# Patient Record
Sex: Female | Born: 1960
Health system: Southern US, Community
[De-identification: ages and names within clinical notes are randomized; demographics above are authoritative.]

## PROBLEM LIST (undated history)

## (undated) DIAGNOSIS — K219 Gastro-esophageal reflux disease without esophagitis: Secondary | ICD-10-CM

## (undated) DIAGNOSIS — N840 Polyp of corpus uteri: Secondary | ICD-10-CM

## (undated) DIAGNOSIS — I10 Essential (primary) hypertension: Secondary | ICD-10-CM

## (undated) DIAGNOSIS — Z85850 Personal history of malignant neoplasm of thyroid: Secondary | ICD-10-CM

## (undated) DIAGNOSIS — E282 Polycystic ovarian syndrome: Secondary | ICD-10-CM

## (undated) DIAGNOSIS — T7840XA Allergy, unspecified, initial encounter: Secondary | ICD-10-CM

## (undated) DIAGNOSIS — E890 Postprocedural hypothyroidism: Secondary | ICD-10-CM

## (undated) DIAGNOSIS — N939 Abnormal uterine and vaginal bleeding, unspecified: Secondary | ICD-10-CM

## (undated) DIAGNOSIS — Z9889 Other specified postprocedural states: Secondary | ICD-10-CM

## (undated) DIAGNOSIS — K8000 Calculus of gallbladder with acute cholecystitis without obstruction: Secondary | ICD-10-CM

## (undated) DIAGNOSIS — D259 Leiomyoma of uterus, unspecified: Secondary | ICD-10-CM

## (undated) DIAGNOSIS — R51 Headache: Secondary | ICD-10-CM

## (undated) DIAGNOSIS — A6000 Herpesviral infection of urogenital system, unspecified: Secondary | ICD-10-CM

## (undated) DIAGNOSIS — Z973 Presence of spectacles and contact lenses: Secondary | ICD-10-CM

## (undated) DIAGNOSIS — E079 Disorder of thyroid, unspecified: Secondary | ICD-10-CM

## (undated) DIAGNOSIS — E669 Obesity, unspecified: Secondary | ICD-10-CM

## (undated) DIAGNOSIS — R112 Nausea with vomiting, unspecified: Secondary | ICD-10-CM

## (undated) DIAGNOSIS — R519 Headache, unspecified: Secondary | ICD-10-CM

## (undated) DIAGNOSIS — E039 Hypothyroidism, unspecified: Secondary | ICD-10-CM

## (undated) DIAGNOSIS — N926 Irregular menstruation, unspecified: Secondary | ICD-10-CM

## (undated) DIAGNOSIS — Z8719 Personal history of other diseases of the digestive system: Secondary | ICD-10-CM

## (undated) DIAGNOSIS — Z9884 Bariatric surgery status: Secondary | ICD-10-CM

## (undated) HISTORY — DX: Essential (primary) hypertension: I10

## (undated) HISTORY — DX: Obesity, unspecified: E66.9

## (undated) HISTORY — DX: Herpesviral infection of urogenital system, unspecified: A60.00

## (undated) HISTORY — DX: Polycystic ovarian syndrome: E28.2

## (undated) HISTORY — DX: Disorder of thyroid, unspecified: E07.9

## (undated) HISTORY — DX: Allergy, unspecified, initial encounter: T78.40XA

## (undated) HISTORY — PX: REDUCTION MAMMAPLASTY: SUR839

---
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
## 1992-06-03 HISTORY — PX: BREAST REDUCTION SURGERY: SHX8

## 1999-08-20 ENCOUNTER — Other Ambulatory Visit: Admission: RE | Admit: 1999-08-20 | Discharge: 1999-08-20 | Payer: Self-pay | Admitting: Obstetrics and Gynecology

## 1999-10-09 ENCOUNTER — Encounter: Payer: Self-pay | Admitting: Obstetrics and Gynecology

## 1999-10-09 ENCOUNTER — Ambulatory Visit (HOSPITAL_COMMUNITY): Admission: RE | Admit: 1999-10-09 | Discharge: 1999-10-09 | Payer: Self-pay | Admitting: Interventional Radiology

## 1999-11-12 ENCOUNTER — Encounter: Payer: Self-pay | Admitting: Obstetrics and Gynecology

## 1999-11-12 ENCOUNTER — Ambulatory Visit (HOSPITAL_COMMUNITY): Admission: RE | Admit: 1999-11-12 | Discharge: 1999-11-12 | Payer: Self-pay | Admitting: Obstetrics and Gynecology

## 1999-11-27 ENCOUNTER — Inpatient Hospital Stay (HOSPITAL_COMMUNITY): Admission: RE | Admit: 1999-11-27 | Discharge: 1999-11-29 | Payer: Self-pay | Admitting: Obstetrics and Gynecology

## 2000-03-06 ENCOUNTER — Encounter: Admission: RE | Admit: 2000-03-06 | Discharge: 2000-06-04 | Payer: Self-pay | Admitting: Family Medicine

## 2000-07-15 ENCOUNTER — Encounter: Admission: RE | Admit: 2000-07-15 | Discharge: 2000-10-13 | Payer: Self-pay | Admitting: Family Medicine

## 2000-11-01 HISTORY — PX: MYOMECTOMY: SHX85

## 2001-02-26 ENCOUNTER — Encounter: Admission: RE | Admit: 2001-02-26 | Discharge: 2001-05-27 | Payer: Self-pay | Admitting: Family Medicine

## 2001-06-12 ENCOUNTER — Ambulatory Visit (HOSPITAL_COMMUNITY): Admission: RE | Admit: 2001-06-12 | Discharge: 2001-06-12 | Payer: Self-pay | Admitting: Family Medicine

## 2001-06-12 ENCOUNTER — Encounter: Payer: Self-pay | Admitting: Family Medicine

## 2001-07-23 ENCOUNTER — Other Ambulatory Visit: Admission: RE | Admit: 2001-07-23 | Discharge: 2001-07-23 | Payer: Self-pay | Admitting: Gynecology

## 2001-09-08 ENCOUNTER — Ambulatory Visit (HOSPITAL_COMMUNITY): Admission: RE | Admit: 2001-09-08 | Discharge: 2001-09-08 | Payer: Self-pay | Admitting: Obstetrics and Gynecology

## 2001-09-08 ENCOUNTER — Encounter (INDEPENDENT_AMBULATORY_CARE_PROVIDER_SITE_OTHER): Payer: Self-pay | Admitting: Specialist

## 2002-04-21 ENCOUNTER — Ambulatory Visit (HOSPITAL_COMMUNITY): Admission: RE | Admit: 2002-04-21 | Discharge: 2002-04-21 | Payer: Self-pay

## 2002-06-04 ENCOUNTER — Encounter: Payer: Self-pay | Admitting: *Deleted

## 2002-06-04 ENCOUNTER — Inpatient Hospital Stay (HOSPITAL_COMMUNITY): Admission: RE | Admit: 2002-06-04 | Discharge: 2002-06-04 | Payer: Self-pay | Admitting: *Deleted

## 2002-07-13 ENCOUNTER — Ambulatory Visit (HOSPITAL_COMMUNITY): Admission: RE | Admit: 2002-07-13 | Discharge: 2002-07-13 | Payer: Self-pay

## 2002-09-27 ENCOUNTER — Inpatient Hospital Stay (HOSPITAL_COMMUNITY): Admission: AD | Admit: 2002-09-27 | Discharge: 2002-09-27 | Payer: Self-pay

## 2002-09-28 ENCOUNTER — Inpatient Hospital Stay (HOSPITAL_COMMUNITY): Admission: AD | Admit: 2002-09-28 | Discharge: 2002-09-28 | Payer: Self-pay

## 2002-10-01 ENCOUNTER — Inpatient Hospital Stay (HOSPITAL_COMMUNITY): Admission: AD | Admit: 2002-10-01 | Discharge: 2002-10-01 | Payer: Self-pay

## 2002-10-05 ENCOUNTER — Encounter: Admission: RE | Admit: 2002-10-05 | Discharge: 2002-10-05 | Payer: Self-pay

## 2002-10-08 ENCOUNTER — Encounter: Admission: RE | Admit: 2002-10-08 | Discharge: 2002-10-08 | Payer: Self-pay

## 2002-10-12 ENCOUNTER — Encounter: Admission: RE | Admit: 2002-10-12 | Discharge: 2002-10-12 | Payer: Self-pay

## 2002-10-15 ENCOUNTER — Inpatient Hospital Stay (HOSPITAL_COMMUNITY): Admission: AD | Admit: 2002-10-15 | Discharge: 2002-10-15 | Payer: Self-pay

## 2002-10-15 ENCOUNTER — Encounter: Admission: RE | Admit: 2002-10-15 | Discharge: 2002-10-15 | Payer: Self-pay

## 2002-10-19 ENCOUNTER — Encounter: Admission: RE | Admit: 2002-10-19 | Discharge: 2002-10-19 | Payer: Self-pay

## 2002-10-20 ENCOUNTER — Inpatient Hospital Stay (HOSPITAL_COMMUNITY): Admission: AD | Admit: 2002-10-20 | Discharge: 2002-10-22 | Payer: Self-pay

## 2002-10-23 ENCOUNTER — Encounter: Admission: RE | Admit: 2002-10-23 | Discharge: 2002-11-22 | Payer: Self-pay

## 2004-03-14 ENCOUNTER — Other Ambulatory Visit: Admission: RE | Admit: 2004-03-14 | Discharge: 2004-03-14 | Payer: Self-pay | Admitting: Obstetrics and Gynecology

## 2005-04-11 ENCOUNTER — Other Ambulatory Visit: Admission: RE | Admit: 2005-04-11 | Discharge: 2005-04-11 | Payer: Self-pay | Admitting: Obstetrics and Gynecology

## 2006-05-07 ENCOUNTER — Other Ambulatory Visit: Admission: RE | Admit: 2006-05-07 | Discharge: 2006-05-07 | Payer: Self-pay | Admitting: Obstetrics and Gynecology

## 2006-05-08 ENCOUNTER — Encounter: Admission: RE | Admit: 2006-05-08 | Discharge: 2006-05-08 | Payer: Self-pay | Admitting: Obstetrics and Gynecology

## 2007-06-10 ENCOUNTER — Other Ambulatory Visit: Admission: RE | Admit: 2007-06-10 | Discharge: 2007-06-10 | Payer: Self-pay | Admitting: Obstetrics and Gynecology

## 2008-04-08 ENCOUNTER — Encounter: Admission: RE | Admit: 2008-04-08 | Discharge: 2008-04-08 | Payer: Self-pay | Admitting: Obstetrics and Gynecology

## 2008-07-15 ENCOUNTER — Other Ambulatory Visit: Admission: RE | Admit: 2008-07-15 | Discharge: 2008-07-15 | Payer: Self-pay | Admitting: Obstetrics and Gynecology

## 2009-05-29 ENCOUNTER — Encounter: Admission: RE | Admit: 2009-05-29 | Discharge: 2009-05-29 | Payer: Self-pay | Admitting: Obstetrics and Gynecology

## 2009-08-25 ENCOUNTER — Other Ambulatory Visit: Admission: RE | Admit: 2009-08-25 | Discharge: 2009-08-25 | Payer: Self-pay | Admitting: Obstetrics and Gynecology

## 2010-05-04 ENCOUNTER — Encounter: Admission: RE | Admit: 2010-05-04 | Discharge: 2010-05-04 | Payer: Self-pay | Admitting: Internal Medicine

## 2010-06-23 ENCOUNTER — Encounter: Payer: Self-pay | Admitting: Internal Medicine

## 2010-10-19 NOTE — Op Note (Signed)
[facility]  Patient:    Stephanie [name], Stephanie [name] Visit Number: [id number] MRN: [id number]          Service Type: DSU Location: [facility] Attending Physician:  Teodora Medici Cabitt Dictated by:   Leatha Gilding. Mezer, M.D. Proc. Date: 09/08/01 Admit Date:  09/08/2001 Discharge Date: 09/08/2001   CC:         Annabell Howells, M.D.; [facility] [address] Vonna [place], [place] [address]                           Operative Report  PREOPERATIVE DIAGNOSES:       Submucous leiomyoma.  POSTOPERATIVE DIAGNOSES:      Endometrial polyp.  SURGEON:                      Leatha Gilding. Mezer, M.D.  ANESTHESIA:                   General.  PREPARATION:                  Betadine.  PROCEDURE:                    With the patient in the lithotomy position she is prepped and draped in a routine fashion.  The uterus sounded to just under 8 cm and the cervix was very easily dilated.  There was tremendous difficulty getting good visualization with the hysteroscope as the balance between the fluid and suction was so exceedingly fine that multiple scopes were tried with no improvement.  Eventually, very adequate visualization of the cavity was obtained and the cavity was remarkably smooth and symmetrical.  The area that had been abnormal on the hysterosalpingogram in the left cornua turned out to have a small amount of polypoid tissue.  This area was probed with the grasping forceps and no myoma was identified.  The polypoid tissue was removed in pieces and one piece sent for pathology.  As it was exceedingly difficult to keep the field, a great attention was to removing the polyp than obtaining tissue.  No D&C was performed.  There was minimal bleeding at the end of the procedure.  Sorbitol was used as the distention medium with a 50 cc deficit. The sponge, instrument, needle counts were correct.  The estimated blood loss was less than 10 cc.  The patient tolerated procedure well.   Was taken to recovery room in satisfactory condition. Dictated by:   Leatha Gilding. Mezer, M.D. Attending Physician:  Rolinda Roan DD:  09/08/01 TD:  09/08/01 Job: [id number]

## 2010-10-19 NOTE — Op Note (Signed)
NAME:  Stephanie [name], Stephanie [name]                         ACCOUNT NO.:  [id number]   MEDICAL RECORD NO.:  [id number]                   PATIENT TYPE:  MAT   LOCATION:  [facility]                                 FACILITY:  [facility]   PHYSICIAN:  Ronda Fairly. Galen Daft, M.D.              DATE OF BIRTH:  March 24, 1961   DATE OF PROCEDURE:  10/20/2002  DATE OF DISCHARGE:  09/28/2002                                 OPERATIVE REPORT   PREOPERATIVE DIAGNOSES:  1. Thirty-seven weeks with pregnancy-induced hypertension and edema.  2. Prior abdominal myomectomy.   POSTOPERATIVE DIAGNOSES:  1. Thirty-seven weeks with pregnancy-induced hypertension and edema.  2. Prior abdominal myomectomy.  3. Uterine fibroids noted on the posterior wall of the uterus.   PROCEDURE:  Primary cesarean section.   SURGEON:  Ronda Fairly. Galen Daft, M.D.   ASSISTANT:  Georgina Peer, M.D.   ESTIMATED BLOOD LOSS:  1500 mL.   ANESTHESIA:  Spinal.   COMPLICATIONS:  None.   FINDINGS:  Female infant, Apgars 8 at one minute, 9 at five minutes.  Cord pH  7.20.  Weight 6 pounds 14 ounces.  Name:  Stephanie [name].   DESCRIPTION OF PROCEDURE:  The patient was identified positively.  She  received prophylactic antibiotics.  A Pfannenstiel incision was not  utilized.  The patient had a prior scar up and down, and this was therefore  utilized after Betadine prep, sterile technique, and Foley catheter.  The up  and down incision was excised the scar, and this was taken down to the  fascial layer.  Considerable subcutaneous edema was noted.  The fascia was  divided and reflected laterally, and the muscles were separated.  The  peritoneum was entered at a point clear of containing structures.  The  peritoneal incision was extended up to the level near the bladder, and care  was taken to avoid the bladder and adjacent organs.  The bladder blade was  inserted, the visceral peritoneum overlying the uterus was reflected  downward, and the bladder blade was  reinserted inside this.  This allowed  for a low cervical transverse uterine incision to be carried out.  The baby  was delivered in a vertex presentation.  It was a baby boy, 6 pounds 14  ounces, Apgars 8 at one minute and 9 at five minutes.  The cord pH was 7.20.  The placenta was manually extracted, and the uterus was closed in two layers  using 0 Vicryl suture running locking, followed by an imbricating layer.  There was complete hemostasis noted.  The uterus had posterior fibroids  approximately lemon-sized, two of them, one on the left and right side, and  also a small walnut-sized fibroid on the anterior surface of the uterus.  These were not bleeding and not felt to be of great concern.  There were no  significant adhesions present.  The omentum was adherent to the fibroids,  but this  was felt to be noncontributory to any disease process.  The  anterior surface of the uterus was free of adhesions.  The uterus was placed  back in the abdominal cavity and had complete hemostasis again noted on  inspection.  Tubes and ovaries were unremarkable, although the right ovary  and tube distal segment was difficult to identify separate from the fibroid  on that side.  The abdomen was irrigated and all products of conception were  removed, the amniotic fluid, etc.  The areas were completely hemostatic.  The instrument, sponge, and needle counts were correct.  The  musculoperitoneum was closed at the midline, all subfascial tissues were  hemostatic, and the fascia was closed with #1 PDS.  The #1 PDS closure was  then followed by 3-0 Vicryl suture to reapproximate the subcutaneous  tissues, and the skin was closed with 3-0 Monocryl, followed by Steri-Strips  over Benzoin.  All instrument, sponge, and needle counts were correct  throughout the case.  The patient left the operating room in stable  condition.                                               Ronda Fairly. Galen Daft, M.D.    NJT/MEDQ  D:   10/21/2002  T:  10/21/2002  Job:  [id number]

## 2010-10-19 NOTE — Discharge Summary (Signed)
NAME:  Stephanie [name], Stephanie [name]                         ACCOUNT NO.:  [id number]   MEDICAL RECORD NO.:  [id number]                   PATIENT TYPE:  INP   LOCATION:  9147                                 FACILITY:  [facility]   PHYSICIAN:  Ronda Fairly. Galen Daft, M.D.              DATE OF BIRTH:  1960/12/10   DATE OF ADMISSION:  10/20/2002  DATE OF DISCHARGE:  10/22/2002                                 DISCHARGE SUMMARY   ADMISSION DIAGNOSIS:  1. Primary cesarean section for prior uterine surgery myomectomy  2. Term pregnancy with pregnancy induced hypertension.   PRINCIPAL DIAGNOSIS:  1. Primary cesarean section for prior uterine surgery myomectomy.  2. Term pregnancy with pregnancy induced hypertension.   COMPLICATIONS OF HOSPITALIZATION:  None.   FINAL DIAGNOSES:  1. Term pregnancy delivered.  2. Pregnancy induced hypertension, resolved.   PRINCIPAL PROCEDURE:  Primary low-transverse cesarean section.   COMPLICATIONS:  None.   HOSPITAL COURSE:  Problem 1.  Patient was admitted on the 19th.  She had  elevated blood pressures and the patient had no significant proteinuria.  She was [redacted] weeks gestation.  The patient had consented to cesarean section  for delivery secondary to prior myomectomy.  The possibility of vaginal  birth was explained but felt to be not prudent because of the prior  myomectomy and the extent of which was not certain from the operative  report.   Problem 2.  After removing the scar from the prior laparotomy incision the  abdomen was entered without difficulty.  There was marked subcutaneous edema  present upon entering the abdomen.  This was significant in it's amount.  The abdomen was entered without difficulty.  The bladder blade was utilized  and a low cervical transverse uteri incision was carried out after  developing the bladder flap.  The baby was in a vertex presentation, occiput  transverse.  It was a baby boy, named Trace Edward and pediatricians were  present.   Cord pH was 7.20 and he was handed off to the staff in excellent  condition.   Problem 3.  The Mom did well after the surgery.  She had normal return of  bowel function.  Was tolerating a regular diet.  There was a little bit of  erythema around the wound on postoperative day #2.  She had received  prophylactic antibiotics in the form of Ancef.  My concern was that perhaps  this was a resistant species to cephalosporin therefore, I started her on  Bactrim double strength.  She had no fever or elevated white count.  Her  postoperative hemoglobin was 8.8 grams.    DISPOSITION:  She was discharged home with Percocet, Bactrim DS for 14 days  twice a day.  Followup in the office in a week or two and full discharge  instructions regarding activity limits, wound care, followup in the office,  medications and diet were explained.  Ronda Fairly. Galen Daft, M.D.    NJT/MEDQ  D:  10/22/2002  T:  10/23/2002  Job:  [id number]

## 2010-10-31 ENCOUNTER — Other Ambulatory Visit: Payer: Self-pay | Admitting: Obstetrics and Gynecology

## 2010-10-31 DIAGNOSIS — Z1231 Encounter for screening mammogram for malignant neoplasm of breast: Secondary | ICD-10-CM

## 2010-11-08 ENCOUNTER — Other Ambulatory Visit: Payer: Self-pay | Admitting: Obstetrics and Gynecology

## 2010-11-08 DIAGNOSIS — N6310 Unspecified lump in the right breast, unspecified quadrant: Secondary | ICD-10-CM

## 2010-11-28 ENCOUNTER — Ambulatory Visit
Admission: RE | Admit: 2010-11-28 | Discharge: 2010-11-28 | Disposition: A | Discharge status: Home / Self Care | Payer: BC Managed Care – PPO | Source: Ambulatory Visit | Attending: Obstetrics and Gynecology | Admitting: Obstetrics and Gynecology

## 2010-11-28 ENCOUNTER — Other Ambulatory Visit: Payer: Self-pay | Admitting: Obstetrics and Gynecology

## 2010-11-28 ENCOUNTER — Ambulatory Visit: Payer: Self-pay

## 2010-11-28 DIAGNOSIS — N6310 Unspecified lump in the right breast, unspecified quadrant: Secondary | ICD-10-CM

## 2011-04-18 ENCOUNTER — Ambulatory Visit: Payer: BC Managed Care – PPO | Admitting: Internal Medicine

## 2011-04-23 ENCOUNTER — Ambulatory Visit (INDEPENDENT_AMBULATORY_CARE_PROVIDER_SITE_OTHER): Payer: BC Managed Care – PPO | Admitting: Internal Medicine

## 2011-04-23 ENCOUNTER — Encounter: Payer: Self-pay | Admitting: Internal Medicine

## 2011-04-23 DIAGNOSIS — N6002 Solitary cyst of left breast: Secondary | ICD-10-CM

## 2011-04-23 DIAGNOSIS — E282 Polycystic ovarian syndrome: Secondary | ICD-10-CM | POA: Insufficient documentation

## 2011-04-23 DIAGNOSIS — E079 Disorder of thyroid, unspecified: Secondary | ICD-10-CM | POA: Insufficient documentation

## 2011-04-23 DIAGNOSIS — N6009 Solitary cyst of unspecified breast: Secondary | ICD-10-CM

## 2011-04-23 DIAGNOSIS — T7840XA Allergy, unspecified, initial encounter: Secondary | ICD-10-CM | POA: Insufficient documentation

## 2011-04-23 DIAGNOSIS — I10 Essential (primary) hypertension: Secondary | ICD-10-CM | POA: Insufficient documentation

## 2011-04-23 DIAGNOSIS — A6000 Herpesviral infection of urogenital system, unspecified: Secondary | ICD-10-CM | POA: Insufficient documentation

## 2011-04-23 DIAGNOSIS — E669 Obesity, unspecified: Secondary | ICD-10-CM | POA: Insufficient documentation

## 2011-04-23 NOTE — Patient Instructions (Signed)
Will set up appt with Dr. Kinnie Scales

## 2011-04-23 NOTE — Progress Notes (Signed)
  Subjective:    Patient ID: Stephanie [name], female    DOB: 12/20/1960, 50 y.o.   MRN: [id number]  HPI  New pt. Here for first visit.  Former pt of mine.   PMH of Hypothyroidism, Gential herpes, HTN, PCOS, allergic rhinitis and obesity.    Overall doing very well but quite frustrated with weight issues. She has tried portion control, has seen several nutritionists. and really does not want surgical intervention.  She feels she needs something structured.  Recent TSH normal  She is up to date on all health maintenance issues.  Recent L breast cyst this year  No Known Allergies Past Medical History  Diagnosis Date  . Polycystic ovarian syndrome   . Allergy   . Hypertension   . Thyroid disease   . Obesity   . Herpes genitalia    Past Surgical History  Procedure Date  . Breast reduction surgery 1994  . Myomectomy 11/2000  . Cesarean section 5/04   History   Social History  . Marital Status: Single    Spouse Name: N/A    Number of Children: N/A  . Years of Education: N/A   Occupational History  . Not on file.   Social History Main Topics  . Smoking status: Never Smoker   . Smokeless tobacco: Never Used  . Alcohol Use: No  . Drug Use: No  . Sexually Active: Yes    Birth Control/ Protection: Pill   Other Topics Concern  . Not on file   Social History Narrative  . No narrative on file   Family History  Problem Relation Age of Onset  . Cancer Mother     leukemia  . Hypertension Father   . Heart disease Maternal Grandmother   . Heart disease Maternal Grandfather   . Aneurysm Maternal Grandfather   . Aneurysm Paternal Grandmother    Patient Active Problem List  Diagnoses  . Allergy  . Polycystic ovarian syndrome  . Hypertension  . Thyroid disease  . Herpes genitalia  . Obesity  . Single cyst of left breast   No current outpatient prescriptions on file prior to visit.        Review of Systems See HPI    Objective:   Physical Exam Physical Exam    Nursing note and vitals reviewed.  Constitutional: She is oriented to person, place, and time. She appears well-developed and well-nourished.  HENT:  Head: Normocephalic and atraumatic.  Cardiovascular: Normal rate and regular rhythm. Exam reveals no gallop and no friction rub.  No murmur heard.  Pulmonary/Chest: Breath sounds normal. She has no wheezes. She has no rales.  Neurological: She is alert and oriented to person, place, and time.  Skin: Skin is warm and dry.  Psychiatric: She has a normal mood and affect. Her behavior is normal.         Assessment & Plan:  1)  Obesity will refer to Dr. Kinnie Scales  For treatment options 2)  HTN  Well controlled 3)  Hypothyroidsim 4) PCOS  Dr. Sharl Ma following 5)  Uterine polyp 6)  L breast cyst

## 2011-07-01 ENCOUNTER — Ambulatory Visit (INDEPENDENT_AMBULATORY_CARE_PROVIDER_SITE_OTHER): Payer: BC Managed Care – PPO | Admitting: Internal Medicine

## 2011-07-01 ENCOUNTER — Encounter: Payer: Self-pay | Admitting: Internal Medicine

## 2011-07-01 DIAGNOSIS — E079 Disorder of thyroid, unspecified: Secondary | ICD-10-CM

## 2011-07-01 DIAGNOSIS — E669 Obesity, unspecified: Secondary | ICD-10-CM

## 2011-07-01 DIAGNOSIS — I10 Essential (primary) hypertension: Secondary | ICD-10-CM

## 2011-07-01 DIAGNOSIS — E282 Polycystic ovarian syndrome: Secondary | ICD-10-CM

## 2011-07-01 LAB — CBC WITH DIFFERENTIAL/PLATELET
Basophils Absolute: 0 10*3/uL (ref 0.0–0.1)
Basophils Relative: 0 % (ref 0–1)
Eosinophils Absolute: 0.1 10*3/uL (ref 0.0–0.7)
Eosinophils Relative: 1 % (ref 0–5)
HCT: 42.8 % (ref 36.0–46.0)
Hemoglobin: 14.4 g/dL (ref 12.0–15.0)
Lymphocytes Relative: 33 % (ref 12–46)
Lymphs Abs: 2.9 10*3/uL (ref 0.7–4.0)
MCH: 30.5 pg (ref 26.0–34.0)
MCHC: 33.6 g/dL (ref 30.0–36.0)
MCV: 90.7 fL (ref 78.0–100.0)
Monocytes Absolute: 0.5 10*3/uL (ref 0.1–1.0)
Monocytes Relative: 6 % (ref 3–12)
Neutro Abs: 5.4 10*3/uL (ref 1.7–7.7)
Neutrophils Relative %: 60 % (ref 43–77)
Platelets: 311 10*3/uL (ref 150–400)
RBC: 4.72 MIL/uL (ref 3.87–5.11)
RDW: 13 % (ref 11.5–15.5)
WBC: 8.9 10*3/uL (ref 4.0–10.5)

## 2011-07-01 NOTE — Patient Instructions (Signed)
Schedule Complete physical with me in June  Labs will be mailed to you

## 2011-07-01 NOTE — Progress Notes (Signed)
Subjective:    Patient ID: Stephanie [name], female    DOB: August 05, 1960, 51 y.o.   MRN: [id number]  HPI Aryelle is here for follow up on her obesity.  She has seen Dr. Kinnie Scales who would like her to start meds.  She has not had any labs since August of last year.  I do not have copy of former labs.  Doing fine.  She reports she had pap smear in June with Dr. Thomasena Edis  No Known Allergies Past Medical History  Diagnosis Date  . Polycystic ovarian syndrome   . Allergy   . Hypertension   . Thyroid disease   . Obesity   . Herpes genitalia    Past Surgical History  Procedure Date  . Breast reduction surgery 1994  . Myomectomy 11/2000  . Cesarean section 5/04   History   Social History  . Marital Status: Single    Spouse Name: N/A    Number of Children: N/A  . Years of Education: N/A   Occupational History  . Not on file.   Social History Main Topics  . Smoking status: Never Smoker   . Smokeless tobacco: Never Used  . Alcohol Use: No  . Drug Use: No  . Sexually Active: Yes    Birth Control/ Protection: Pill   Other Topics Concern  . Not on file   Social History Narrative  . No narrative on file   Family History  Problem Relation Age of Onset  . Cancer Mother     leukemia  . Hypertension Father   . Heart disease Maternal Grandmother   . Heart disease Maternal Grandfather   . Aneurysm Maternal Grandfather   . Aneurysm Paternal Grandmother    Patient Active Problem List  Diagnoses  . Allergy  . Polycystic ovarian syndrome  . Hypertension  . Thyroid disease  . Herpes genitalia  . Obesity  . Single cyst of left breast   Current Outpatient Prescriptions on File Prior to Visit  Medication Sig Dispense Refill  . amLODipine (NORVASC) 10 MG tablet Take 1 tablet by mouth Daily.      . benazepril (LOTENSIN) 20 MG tablet Take 1 tablet by mouth Daily.      Marland Kitchen LOESTRIN 24 FE 1-20 MG-MCG tablet Take 1 tablet by mouth Daily.      Marland Kitchen loratadine (CLARITIN) 10 MG tablet Take  10 mg by mouth daily as needed.        Marland Kitchen SYNTHROID 50 MCG tablet Take 1 tablet by mouth Daily.           Review of Systems    no chest pain no sob no Le edema No Headache  No visual changes Objective:   Physical Exam  Physical Exam  Nursing note and vitals reviewed.  Constitutional: She is oriented to person, place, and time. She appears well-developed and well-nourished.  HENT:  Head: Normocephalic and atraumatic.  Cardiovascular: Normal rate and regular rhythm. Exam reveals no gallop and no friction rub.  No murmur heard.  Pulmonary/Chest: Breath sounds normal. She has no wheezes. She has no rales.  Neurological: She is alert and oriented to person, place, and time.  Skin: Skin is warm and dry.  Psychiatric: She has a normal mood and affect. Her behavior is normal.        Assessment & Plan:  1)  Morbid obesity  EKG  Nonspecific  TWI V1-V2  Will check labs today along with Dr. Jennye Boroughs labs 2)  HTN  Well controlled  3)  Hypothyroidism  Check TSH today

## 2011-07-02 LAB — LIPID PANEL
Cholesterol: 185 mg/dL (ref 0–200)
HDL: 46 mg/dL (ref >39)
LDL Cholesterol: 110 mg/dL — ABNORMAL HIGH (ref 0–99)
Total CHOL/HDL Ratio: 4 Ratio
Triglycerides: 143 mg/dL (ref <150)
VLDL: 29 mg/dL (ref 0–40)

## 2011-07-02 LAB — COMPREHENSIVE METABOLIC PANEL
ALT: 37 U/L — ABNORMAL HIGH (ref 0–35)
AST: 25 U/L (ref 0–37)
Albumin: 4.2 g/dL (ref 3.5–5.2)
Alkaline Phosphatase: 45 U/L (ref 39–117)
BUN: 13 mg/dL (ref 6–23)
CO2: 25 mEq/L (ref 19–32)
Calcium: 9.3 mg/dL (ref 8.4–10.5)
Chloride: 103 mEq/L (ref 96–112)
Creat: 0.58 mg/dL (ref 0.50–1.10)
Glucose, Bld: 73 mg/dL (ref 70–99)
Potassium: 3.8 mEq/L (ref 3.5–5.3)
Sodium: 139 mEq/L (ref 135–145)
Total Bilirubin: 0.3 mg/dL (ref 0.3–1.2)
Total Protein: 7 g/dL (ref 6.0–8.3)

## 2011-07-02 LAB — HEMOGLOBIN A1C
Hgb A1c MFr Bld: 5.2 % (ref <5.7)
Mean Plasma Glucose: 103 mg/dL (ref <117)

## 2011-07-02 LAB — INSULIN, RANDOM: Insulin: 14 u[IU]/mL (ref 3–28)

## 2011-07-02 LAB — TSH: TSH: 2.453 u[IU]/mL (ref 0.350–4.500)

## 2011-07-03 ENCOUNTER — Encounter: Payer: Self-pay | Admitting: Emergency Medicine

## 2011-09-09 ENCOUNTER — Telehealth: Payer: Self-pay | Admitting: Internal Medicine

## 2011-09-09 MED ORDER — BENAZEPRIL HCL 20 MG PO TABS: 20.0000 mg | ORAL_TABLET | Freq: Every day | ORAL | Status: DC

## 2011-09-09 MED ORDER — LEVOTHYROXINE SODIUM 50 MCG PO TABS: 50.0000 ug | ORAL_TABLET | Freq: Every day | ORAL | Status: DC

## 2011-09-09 MED ORDER — AMLODIPINE BESYLATE 10 MG PO TABS: 10.0000 mg | ORAL_TABLET | Freq: Every day | ORAL | Status: DC

## 2011-09-09 NOTE — Telephone Encounter (Signed)
Spoke with pt.  She is counseled to make appt  With me in the next 6 month For CPe.  She is also aware of slightly elevated tranaminase and she tells me Dr. Kinnie Scales has repeated labs

## 2011-09-09 NOTE — Telephone Encounter (Signed)
Pt states she needs all three medications: AmLODIPine Besylate (Tab) NORVASC 10 MG Take 1 tablet by mouth Daily. Benazepril HCl (Tab) LOTENSIN 20 MG Take 1 tablet by mouth Daily.  Levothyroxine Sodium (Tab) SYNTHROID 50 MCG Take 1 tablet by mouth Daily.   They need to be call into [facility] for a 90 day refill for a year. She states you all talked about this last visit. Please call her if you has any questions [phone]. Thanks

## 2011-11-01 ENCOUNTER — Other Ambulatory Visit: Payer: Self-pay | Admitting: Obstetrics and Gynecology

## 2011-11-01 DIAGNOSIS — Z1231 Encounter for screening mammogram for malignant neoplasm of breast: Secondary | ICD-10-CM

## 2011-11-27 ENCOUNTER — Telehealth: Payer: Self-pay | Admitting: *Deleted

## 2011-11-27 NOTE — Telephone Encounter (Signed)
Pt has her Gyn with Dr France Ravens office.  She said she was here in 11/12 and 2/13.  She had blood work and and EKG.  She says you told her to come back in 6 months.  She states that she needs labwork done per you and then RF on B/P meds and Thyroid meds.  What labwork does she need.

## 2011-11-29 ENCOUNTER — Ambulatory Visit
Admission: RE | Admit: 2011-11-29 | Discharge: 2011-11-29 | Disposition: A | Discharge status: Home / Self Care | Payer: 59 | Source: Ambulatory Visit | Attending: Obstetrics and Gynecology | Admitting: Obstetrics and Gynecology

## 2011-11-29 DIAGNOSIS — Z1231 Encounter for screening mammogram for malignant neoplasm of breast: Secondary | ICD-10-CM

## 2011-12-03 ENCOUNTER — Other Ambulatory Visit: Payer: Self-pay | Admitting: Nurse Practitioner

## 2011-12-03 ENCOUNTER — Other Ambulatory Visit (HOSPITAL_COMMUNITY)
Admission: RE | Admit: 2011-12-03 | Discharge: 2011-12-03 | Disposition: A | Discharge status: Home / Self Care | Payer: 59 | Source: Ambulatory Visit | Attending: Obstetrics and Gynecology | Admitting: Obstetrics and Gynecology

## 2011-12-03 DIAGNOSIS — Z01419 Encounter for gynecological examination (general) (routine) without abnormal findings: Secondary | ICD-10-CM | POA: Insufficient documentation

## 2011-12-10 ENCOUNTER — Other Ambulatory Visit (INDEPENDENT_AMBULATORY_CARE_PROVIDER_SITE_OTHER): Payer: 59

## 2011-12-10 DIAGNOSIS — Z0000 Encounter for general adult medical examination without abnormal findings: Secondary | ICD-10-CM

## 2011-12-10 LAB — CBC WITH DIFFERENTIAL/PLATELET
Basophils Absolute: 0 10*3/uL (ref 0.0–0.1)
Basophils Relative: 0 % (ref 0–1)
Eosinophils Absolute: 0.1 10*3/uL (ref 0.0–0.7)
Eosinophils Relative: 1 % (ref 0–5)
HCT: 41.3 % (ref 36.0–46.0)
Hemoglobin: 13.9 g/dL (ref 12.0–15.0)
Lymphocytes Relative: 32 % (ref 12–46)
Lymphs Abs: 2.5 10*3/uL (ref 0.7–4.0)
MCH: 30.6 pg (ref 26.0–34.0)
MCHC: 33.7 g/dL (ref 30.0–36.0)
MCV: 91 fL (ref 78.0–100.0)
Monocytes Absolute: 0.4 10*3/uL (ref 0.1–1.0)
Monocytes Relative: 6 % (ref 3–12)
Neutro Abs: 4.8 10*3/uL (ref 1.7–7.7)
Neutrophils Relative %: 61 % (ref 43–77)
Platelets: 310 10*3/uL (ref 150–400)
RBC: 4.54 MIL/uL (ref 3.87–5.11)
RDW: 13.8 % (ref 11.5–15.5)
WBC: 7.9 10*3/uL (ref 4.0–10.5)

## 2011-12-11 ENCOUNTER — Encounter: Payer: Self-pay | Admitting: *Deleted

## 2011-12-11 LAB — LIPID PANEL
Cholesterol: 183 mg/dL (ref 0–200)
HDL: 47 mg/dL (ref >39)
LDL Cholesterol: 115 mg/dL — ABNORMAL HIGH (ref 0–99)
Total CHOL/HDL Ratio: 3.9 Ratio
Triglycerides: 105 mg/dL (ref <150)
VLDL: 21 mg/dL (ref 0–40)

## 2011-12-11 LAB — COMPLETE METABOLIC PANEL WITH GFR
ALT: 24 U/L (ref 0–35)
AST: 17 U/L (ref 0–37)
Albumin: 4.1 g/dL (ref 3.5–5.2)
Alkaline Phosphatase: 44 U/L (ref 39–117)
BUN: 18 mg/dL (ref 6–23)
CO2: 26 mEq/L (ref 19–32)
Calcium: 9.1 mg/dL (ref 8.4–10.5)
Chloride: 104 mEq/L (ref 96–112)
Creat: 0.77 mg/dL (ref 0.50–1.10)
GFR, Est African American: >89 mL/min
GFR, Est Non African American: >89 mL/min
Glucose, Bld: 84 mg/dL (ref 70–99)
Potassium: 4 mEq/L (ref 3.5–5.3)
Sodium: 139 mEq/L (ref 135–145)
Total Bilirubin: 0.6 mg/dL (ref 0.3–1.2)
Total Protein: 6.4 g/dL (ref 6.0–8.3)

## 2011-12-11 LAB — TSH: TSH: 3.361 u[IU]/mL (ref 0.350–4.500)

## 2011-12-11 LAB — VITAMIN D 25 HYDROXY (VIT D DEFICIENCY, FRACTURES): Vit D, 25-Hydroxy: 41 ng/mL (ref 30–89)

## 2012-02-13 ENCOUNTER — Other Ambulatory Visit: Payer: Self-pay | Admitting: Internal Medicine

## 2012-02-25 ENCOUNTER — Telehealth: Payer: Self-pay | Admitting: *Deleted

## 2012-02-25 NOTE — Telephone Encounter (Signed)
Pt concerned about recent hormonal changes and acne appt scheduled

## 2012-03-05 ENCOUNTER — Ambulatory Visit (INDEPENDENT_AMBULATORY_CARE_PROVIDER_SITE_OTHER): Payer: 59 | Admitting: Internal Medicine

## 2012-03-05 ENCOUNTER — Encounter: Payer: Self-pay | Admitting: Internal Medicine

## 2012-03-05 VITALS — BP 124/84 | HR 93 | Temp 97.4°F | Resp 16 | Wt 230.0 lb

## 2012-03-05 DIAGNOSIS — Z23 Encounter for immunization: Secondary | ICD-10-CM

## 2012-03-05 DIAGNOSIS — L709 Acne, unspecified: Secondary | ICD-10-CM

## 2012-03-05 DIAGNOSIS — L708 Other acne: Secondary | ICD-10-CM

## 2012-03-05 MED ORDER — TRETINOIN 0.025 % EX CREA: TOPICAL_CREAM | CUTANEOUS | Status: DC

## 2012-03-05 MED ORDER — LEVONORG-ETH ESTRAD TRIPHASIC PO TABS: 1.0000 | ORAL_TABLET | Freq: Every day | ORAL | Status: DC

## 2012-03-05 NOTE — Patient Instructions (Addendum)
Will change to [place] [address]

## 2012-03-05 NOTE — Progress Notes (Signed)
Subjective:    Patient ID: Stephanie [name], female    DOB: 25-Feb-1961, 51 y.o.   MRN: [id number]  HPI Pt is here for acute visit.  She is concerned about skin changes with increasing acne and wondering if it is hormonally related.  She also has a history of PCOS.  Stephanie [name] was placed on Loestrin by her former GYN  Dr. Thomasena [name] several years ago .  She is a non-smoker..  She states her menses have been getting lighter in flow She has had acne breakouts last few months and she does admit to picking on her face.  She is very happy as she has lost about 30 lbs with weight loss program with Dr. Kinnie [name]  No Known Allergies Past Medical History  Diagnosis Date  . Polycystic ovarian syndrome   . Allergy   . Hypertension   . Thyroid disease   . Obesity   . Herpes genitalia    Past Surgical History  Procedure Date  . Breast reduction surgery 1994  . Myomectomy 11/2000  . Cesarean section 5/04   History   Social History  . Marital Status: Single    Spouse Name: N/A    Number of Children: N/A  . Years of Education: N/A   Occupational History  . Not on file.   Social History Main Topics  . Smoking status: Never Smoker   . Smokeless tobacco: Never Used  . Alcohol Use: No  . Drug Use: No  . Sexually Active: Yes    Birth Control/ Protection: Pill   Other Topics Concern  . Not on file   Social History Narrative  . No narrative on file   Family History  Problem Relation Age of Onset  . Cancer Mother     leukemia  . Hypertension Father   . Heart disease Maternal Grandmother   . Heart disease Maternal Grandfather   . Aneurysm Maternal Grandfather   . Aneurysm Paternal Grandmother    Patient Active Problem List  Diagnosis  . Allergy  . Polycystic ovarian syndrome  . Hypertension  . Thyroid disease  . Herpes genitalia  . Obesity  . Single cyst of left breast   Current Outpatient Prescriptions on File Prior to Visit  Medication Sig Dispense Refill  . amLODipine (NORVASC)  10 MG tablet Take 1 tablet (10 mg total) by mouth daily.  90 tablet  1  . benazepril (LOTENSIN) 20 MG tablet Take 1 tablet (20 mg total) by mouth daily.  90 tablet  1  . levothyroxine (SYNTHROID, LEVOTHROID) 50 MCG tablet TAKE 1 TABLET DAILY  90 tablet  3  . LOESTRIN 24 FE 1-20 MG-MCG tablet Take 1 tablet by mouth Daily.      Marland Kitchen loratadine (CLARITIN) 10 MG tablet Take 10 mg by mouth daily as needed.        . phentermine 37.5 MG capsule Take 37.5 mg by mouth every morning.      Marland Kitchen spironolactone (ALDACTONE) 50 MG tablet Take 50 mg by mouth 2 (two) times daily.           Review of Systems See HPI    Objective:   Physical Exam Physical Exam  Nursing note and vitals reviewed.  Constitutional: She is oriented to person, place, and time. She appears well-developed and well-nourished.  HENT:  Head: Normocephalic and atraumatic.  Cardiovascular: Normal rate and regular rhythm. Exam reveals no gallop and no friction rub.  No murmur heard.  Pulmonary/Chest: Breath sounds normal. She has no  wheezes. She has no rales.  Neurological: She is alert and oriented to person, place, and time.  Skin: Skin is warm and dry. Face few healing inflammatory lesions with a few blackheads Psychiatric: She has a normal mood and affect. Her behavior is normal.        Assessment & Plan:  Acne:  May be due to increasing ratio of testosterone in peri-menopausal woman  Will try a triphasic OC  Along with retin A creme.  Advise to avoid eye, nasal folds, and corners of mouth.  Start qod cream and advance to daily as skin tolerates  PCOS  We also discussed possiblity of coming off of Oc's but decision is to be made when she sees Stephanie [name] her endocrinologist

## 2012-03-06 ENCOUNTER — Encounter: Payer: Self-pay | Admitting: Internal Medicine

## 2012-03-06 DIAGNOSIS — L709 Acne, unspecified: Secondary | ICD-10-CM | POA: Insufficient documentation

## 2012-04-04 ENCOUNTER — Other Ambulatory Visit: Payer: Self-pay | Admitting: Internal Medicine

## 2012-05-04 ENCOUNTER — Ambulatory Visit: Payer: 59 | Admitting: Internal Medicine

## 2012-08-11 ENCOUNTER — Telehealth: Payer: Self-pay | Admitting: *Deleted

## 2012-08-11 NOTE — Telephone Encounter (Signed)
Pt wants a name of a good podiatrist

## 2012-08-12 NOTE — Telephone Encounter (Signed)
I like Dr. Jeni Salles (not sure if spelled correctly  On [address]  She does not need a formal referral from [place]

## 2012-08-18 NOTE — Telephone Encounter (Signed)
LVM with name of podiatrist

## 2012-10-14 ENCOUNTER — Other Ambulatory Visit: Payer: Self-pay | Admitting: Internal Medicine

## 2012-10-14 NOTE — Telephone Encounter (Signed)
Refill request

## 2012-10-20 ENCOUNTER — Ambulatory Visit (INDEPENDENT_AMBULATORY_CARE_PROVIDER_SITE_OTHER): Payer: 59 | Admitting: Internal Medicine

## 2012-10-20 ENCOUNTER — Encounter: Payer: Self-pay | Admitting: Internal Medicine

## 2012-10-20 VITALS — BP 113/76 | HR 85 | Temp 97.4°F | Resp 18 | Wt 226.0 lb

## 2012-10-20 DIAGNOSIS — J329 Chronic sinusitis, unspecified: Secondary | ICD-10-CM

## 2012-10-20 DIAGNOSIS — R05 Cough: Secondary | ICD-10-CM

## 2012-10-20 DIAGNOSIS — R059 Cough, unspecified: Secondary | ICD-10-CM

## 2012-10-20 DIAGNOSIS — T7840XD Allergy, unspecified, subsequent encounter: Secondary | ICD-10-CM

## 2012-10-20 DIAGNOSIS — Z5189 Encounter for other specified aftercare: Secondary | ICD-10-CM

## 2012-10-20 MED ORDER — METHYLPREDNISOLONE ACETATE 80 MG/ML IJ SUSP
120.0000 mg | Freq: Once | INTRAMUSCULAR | Status: AC
  Administered 2012-10-20: 120 mg via INTRAMUSCULAR

## 2012-10-20 MED ORDER — AZITHROMYCIN 250 MG PO TABS: ORAL_TABLET | ORAL | Status: DC

## 2012-10-20 NOTE — Patient Instructions (Addendum)
See me in one week

## 2012-10-20 NOTE — Progress Notes (Signed)
  Subjective:    Patient ID: Stephanie [name], female    DOB: 01-08-1961, 52 y.o.   MRN: [id number]  HPI  Lalania is here for acute visit.   She has been having a cough for two weeks nonproductive.  Lots of sinus pressure but does not have much sinus drainage.  No documented fever.  No chest pain no sob no wheezing  She has been using Mucinex D  OTC  Review of Systems    see HPI Objective:   Physical Exam Physical Exam  Peak flow 370 Constitutional: She is oriented to person, place, and time. She appears well-developed and well-nourished. She is cooperative.  HENT:  Head: Normocephalic and atraumatic.  Right Ear: A middle ear effusion is present.  Left Ear: A middle ear effusion is present.  Nose: Mucosal edema present. Right sinus exhibits maxillary sinus tenderness. Left sinus exhibits maxillary sinus tenderness.  Mouth/Throat: Posterior oropharyngeal erythema present.  Serous effusion bilaterally  Eyes: Conjunctivae and EOM are normal. Pupils are equal, round, and reactive to light.  Neck: Neck supple. Carotid bruit is not present. No mass present.  Cardiovascular: Regular rhythm, normal heart sounds, intact distal pulses and normal pulses. Exam reveals no gallop and no friction rub.  No murmur heard.  Pulmonary/Chest: Breath sounds normal. She has no wheezes. She has no rhonchi. She has no rales.  Neurological: She is alert and oriented to person, place, and time.  Skin: Skin is warm and dry. No abrasion, no bruising, no ecchymosis and no rash noted. No cyanosis. Nails show no clubbing.  Psychiatric: She has a normal mood and affect. Her speech is normal and behavior is normal.             Assessment & Plan:  Sinusitis  Will give Z-pak  Allergic rhinitis:  Will give Depo-medrol 120 mg today.  Take claritin D daily  Cough  Likely allergy related  OK for OTC delsym.  Pt counseled to see me next week.  If cough still present will get CXR  She voices understanding  See  me in one week

## 2012-10-29 ENCOUNTER — Ambulatory Visit (INDEPENDENT_AMBULATORY_CARE_PROVIDER_SITE_OTHER): Payer: 59 | Admitting: Internal Medicine

## 2012-10-29 ENCOUNTER — Encounter: Payer: Self-pay | Admitting: Internal Medicine

## 2012-10-29 ENCOUNTER — Ambulatory Visit (HOSPITAL_BASED_OUTPATIENT_CLINIC_OR_DEPARTMENT_OTHER)
Admission: RE | Admit: 2012-10-29 | Discharge: 2012-10-29 | Disposition: A | Discharge status: Home / Self Care | Payer: 59 | Source: Ambulatory Visit | Attending: Internal Medicine | Admitting: Internal Medicine

## 2012-10-29 ENCOUNTER — Ambulatory Visit: Payer: 59 | Admitting: Internal Medicine

## 2012-10-29 VITALS — BP 120/80 | HR 94 | Temp 97.6°F | Resp 18 | Wt 225.0 lb

## 2012-10-29 DIAGNOSIS — J40 Bronchitis, not specified as acute or chronic: Secondary | ICD-10-CM

## 2012-10-29 DIAGNOSIS — R059 Cough, unspecified: Secondary | ICD-10-CM | POA: Insufficient documentation

## 2012-10-29 DIAGNOSIS — R05 Cough: Secondary | ICD-10-CM

## 2012-10-29 DIAGNOSIS — J209 Acute bronchitis, unspecified: Secondary | ICD-10-CM

## 2012-10-29 DIAGNOSIS — I10 Essential (primary) hypertension: Secondary | ICD-10-CM | POA: Insufficient documentation

## 2012-10-29 MED ORDER — PREDNISONE 20 MG PO TABS: ORAL_TABLET | ORAL | Status: DC

## 2012-10-29 MED ORDER — MOMETASONE FURO-FORMOTEROL FUM 100-5 MCG/ACT IN AERO: INHALATION_SPRAY | RESPIRATORY_TRACT | Status: DC

## 2012-10-29 NOTE — Patient Instructions (Addendum)
See me in 4 weeks 

## 2012-10-29 NOTE — Progress Notes (Signed)
Subjective:    Patient ID: Stephanie [name], female    DOB: Nov 05, 1960, 52 y.o.   MRN: [id number]  HPI Torunn is here for follow up after taking Z-pak and Depomedrol  120 mg IM in office.   Pt was feeling better about 5 days after treatment.  Cough stopped for a few days but now has returned.  She coughs clear mucous at times and describes a "heaviness"   At times in her chest.  No fever or chest pain.    She does hear wheezing at times.    No Known Allergies Past Medical History  Diagnosis Date  . Polycystic ovarian syndrome   . Allergy   . Hypertension   . Thyroid disease   . Obesity   . Herpes genitalia    Past Surgical History  Procedure Laterality Date  . Breast reduction surgery  1994  . Myomectomy  11/2000  . Cesarean section  5/04   History   Social History  . Marital Status: Single    Spouse Name: N/A    Number of Children: N/A  . Years of Education: N/A   Occupational History  . Not on file.   Social History Main Topics  . Smoking status: Never Smoker   . Smokeless tobacco: Never Used  . Alcohol Use: No  . Drug Use: No  . Sexually Active: Yes    Birth Control/ Protection: Pill   Other Topics Concern  . Not on file   Social History Narrative  . No narrative on file   Family History  Problem Relation Age of Onset  . Cancer Mother     leukemia  . Hypertension Father   . Heart disease Maternal Grandmother   . Heart disease Maternal Grandfather   . Aneurysm Maternal Grandfather   . Aneurysm Paternal Grandmother    Patient Active Problem List   Diagnosis Date Noted  . Acne 03/06/2012  . Single cyst of left breast 04/23/2011  . Allergy   . Polycystic ovarian syndrome   . Hypertension   . Thyroid disease   . Herpes genitalia   . Obesity    Current Outpatient Prescriptions on File Prior to Visit  Medication Sig Dispense Refill  . amLODipine (NORVASC) 10 MG tablet TAKE 1 TABLET DAILY  90 tablet  0  . benazepril (LOTENSIN) 20 MG tablet TAKE 1  TABLET DAILY  90 tablet  0  . levonorgestrel-ethinyl estradiol (TRIVORA, 28,) tablet Take 1 tablet by mouth daily.  1 Package  11  . levothyroxine (SYNTHROID, LEVOTHROID) 50 MCG tablet TAKE 1 TABLET DAILY  90 tablet  3  . loratadine (CLARITIN) 10 MG tablet Take 10 mg by mouth daily as needed.        . phentermine 37.5 MG capsule Take 37.5 mg by mouth every morning.      Marland Kitchen spironolactone (ALDACTONE) 50 MG tablet Take 50 mg by mouth 2 (two) times daily.      Marland Kitchen topiramate (TOPAMAX) 100 MG tablet Take 100 mg by mouth daily.       No current facility-administered medications on file prior to visit.       Review of Systems See HPI    Objective:   Physical Exam Physical Exam  Nursing note and vitals reviewed.   Peak flow 360 today Constitutional: She is oriented to person, place, and time. She appears well-developed and well-nourished.  HENT:  Head: Normocephalic and atraumatic.  Cardiovascular: Normal rate and regular rhythm. Exam reveals no gallop and  no friction rub.  No murmur heard.  Pulmonary/Chest: Breath sounds normal. Few end expiratory wheezes bilaterally. She has no rales.  Neurological: She is alert and oriented to person, place, and time.  Skin: Skin is warm and dry.  Psychiatric: She has a normal mood and affect. Her behavior is normal.             Assessment & Plan:  Bronchospasm/Probable asthmatic bronchitis  Will give Prednisone 60 mg taper over 9 days.  Dulera MDI.     Prolonged cough  Will check CXR  Allergic rhinitis  Advised to take OTC Claritin D or Allegra D  See me in 4-5 weeks

## 2012-10-30 ENCOUNTER — Telehealth: Payer: Self-pay | Admitting: *Deleted

## 2012-10-30 NOTE — Telephone Encounter (Signed)
Notified pt of - chest x ray. Pt reports feeling much better after using inhaler

## 2012-10-30 NOTE — Telephone Encounter (Signed)
Message copied by Mathews Robinsons on Fri Oct 30, 2012  8:33 AM ------      Message from: Raechel Chute D      Created: Thu Oct 29, 2012  1:09 PM       Karen Kitchens            Call pt and let her know that her CXR is negative for infection ------

## 2012-11-02 ENCOUNTER — Telehealth: Payer: Self-pay | Admitting: *Deleted

## 2012-11-02 NOTE — Telephone Encounter (Signed)
Patient called Dr Constance Goltz wanted her to take Clariten D daily, until her next appointment but she went to [facility] and was unable to get it.  When they entered her DL# it said she could not buy it.  The Pharmacist recommended that she have her Dr call in a Rx for a decongestant; so she won't have this problem. [facility].

## 2012-11-04 NOTE — Telephone Encounter (Signed)
See Stephanie [name]'s note 

## 2012-12-09 ENCOUNTER — Ambulatory Visit (INDEPENDENT_AMBULATORY_CARE_PROVIDER_SITE_OTHER): Payer: 59 | Admitting: Internal Medicine

## 2012-12-09 ENCOUNTER — Encounter: Payer: Self-pay | Admitting: Internal Medicine

## 2012-12-09 VITALS — BP 121/80 | HR 92 | Temp 98.8°F | Resp 18 | Wt 231.0 lb

## 2012-12-09 DIAGNOSIS — J9801 Acute bronchospasm: Secondary | ICD-10-CM

## 2012-12-09 DIAGNOSIS — L309 Dermatitis, unspecified: Secondary | ICD-10-CM | POA: Insufficient documentation

## 2012-12-09 DIAGNOSIS — L259 Unspecified contact dermatitis, unspecified cause: Secondary | ICD-10-CM

## 2012-12-09 MED ORDER — MOMETASONE FUROATE 0.1 % EX CREA: TOPICAL_CREAM | Freq: Every day | CUTANEOUS | Status: DC

## 2012-12-09 NOTE — Patient Instructions (Addendum)
See me as needed 

## 2012-12-09 NOTE — Progress Notes (Signed)
Subjective:    Patient ID: Stephanie [name], female    DOB: 1961/04/11, 52 y.o.   MRN: [id number]  HPI  Stephanie [name] is here for follow up.  She reports she is feeling much better.  She went to see Dr. Kinnie Scales and described her symptoms.  He discussed possibility of perhaps Topamax may have been causing her symptoms.  She was on 100 mg dose. She stopped med and withing 48 hours felt much better and her breathing better.  Pt was also on prednisone and Dulera at that same time frame.  She used her [facility] for 3 weeks and then felt so much better she stopped.      Peak flow today 375  She has noted very red itchy rash back of both knees.  Not in any other location  No tick or insect recent exposure  No Known Allergies Past Medical History  Diagnosis Date  . Polycystic ovarian syndrome   . Allergy   . Hypertension   . Thyroid disease   . Obesity   . Herpes genitalia    Past Surgical History  Procedure Laterality Date  . Breast reduction surgery  1994  . Myomectomy  11/2000  . Cesarean section  5/04   History   Social History  . Marital Status: Single    Spouse Name: N/A    Number of Children: N/A  . Years of Education: N/A   Occupational History  . Not on file.   Social History Main Topics  . Smoking status: Never Smoker   . Smokeless tobacco: Never Used  . Alcohol Use: No  . Drug Use: No  . Sexually Active: Yes    Birth Control/ Protection: Pill   Other Topics Concern  . Not on file   Social History Narrative  . No narrative on file   Family History  Problem Relation Age of Onset  . Cancer Mother     leukemia  . Hypertension Father   . Heart disease Maternal Grandmother   . Heart disease Maternal Grandfather   . Aneurysm Maternal Grandfather   . Aneurysm Paternal Grandmother    Patient Active Problem List   Diagnosis Date Noted  . Acne 03/06/2012  . Single cyst of left breast 04/23/2011  . Allergy   . Polycystic ovarian syndrome   . Hypertension   .  Thyroid disease   . Herpes genitalia   . Obesity    Current Outpatient Prescriptions on File Prior to Visit  Medication Sig Dispense Refill  . amLODipine (NORVASC) 10 MG tablet TAKE 1 TABLET DAILY  90 tablet  0  . benazepril (LOTENSIN) 20 MG tablet TAKE 1 TABLET DAILY  90 tablet  0  . levonorgestrel-ethinyl estradiol (TRIVORA, 28,) tablet Take 1 tablet by mouth daily.  1 Package  11  . levothyroxine (SYNTHROID, LEVOTHROID) 50 MCG tablet TAKE 1 TABLET DAILY  90 tablet  3  . loratadine (CLARITIN) 10 MG tablet Take 10 mg by mouth daily as needed.        . phentermine 37.5 MG capsule Take 37.5 mg by mouth every morning.      Marland Kitchen spironolactone (ALDACTONE) 50 MG tablet Take 50 mg by mouth 2 (two) times daily.       No current facility-administered medications on file prior to visit.      Review of Systems See HPI    Objective:   Physical Exam  Physical Exam  Nursing note and vitals reviewed.  Constitutional: She is oriented to person,  place, and time. She appears well-developed and well-nourished.  HENT:  Head: Normocephalic and atraumatic.  Cardiovascular: Normal rate and regular rhythm. Exam reveals no gallop and no friction rub.  No murmur heard.  Pulmonary/Chest: Breath sounds normal. She has no wheezes. She has no rales.  Neurological: She is alert and oriented to person, place, and time.  Skin: Skin is warm and dry.  She has reddened maculpapular rash behind both knees.   Not present of flexroral surface of elbows or elsewhere Psychiatric: She has a normal mood and affect. Her behavior is normal.            Assessment & Plan:  Bronchospasm/?? Asthmatic bronchitis    Peak flow today 375  Improved.  Lungs clear.  NOt sure if effect of Topamax but pt off now.  Will moniter with time.  OK to hold [facility] for now  Skin rash  consistant with eczema  Okj for elocon creme once a day.  Pt counseled if rash not improving to see her dermatologist Dr. Danella Deis

## 2012-12-18 ENCOUNTER — Telehealth: Payer: Self-pay | Admitting: *Deleted

## 2012-12-18 NOTE — Telephone Encounter (Signed)
Pt called stating that rash had significantly worsened Advised pt to be seen by her Dermatologist. Sherron Monday with [facility]  at [facility] Who advised that pt needed to be seen at [facility] as there were no appt available, made and appt for pt to be seen in office on Monday.

## 2012-12-21 ENCOUNTER — Encounter: Payer: Self-pay | Admitting: Internal Medicine

## 2012-12-21 ENCOUNTER — Ambulatory Visit (INDEPENDENT_AMBULATORY_CARE_PROVIDER_SITE_OTHER): Payer: 59 | Admitting: Internal Medicine

## 2012-12-21 VITALS — BP 111/76 | HR 86 | Temp 98.1°F | Resp 18 | Wt 232.0 lb

## 2012-12-21 DIAGNOSIS — R21 Rash and other nonspecific skin eruption: Secondary | ICD-10-CM

## 2012-12-21 MED ORDER — DOXYCYCLINE HYCLATE 100 MG PO TABS: 100.0000 mg | ORAL_TABLET | Freq: Two times a day (BID) | ORAL | Status: DC

## 2012-12-21 NOTE — Progress Notes (Signed)
Subjective:    Patient ID: Stephanie [name], female    DOB: [birth year], 52 y.o.   MRN: [id number]  HPI  Stephanie [name] is here for follow up.  She reports the rash behind her knees is better but she still has red bumps along medial side of knee.    No itching she is using Elocon Bid .  She could not get appt with her dermatologist  She denies fever, headache,  Rash has not spread to any other areas.    She does spend time outside in garden  She has dogs and cats that spend time in woods  No Known Allergies Past Medical History  Diagnosis Date  . Polycystic ovarian syndrome   . Allergy   . Hypertension   . Thyroid disease   . Obesity   . Herpes genitalia    Past Surgical History  Procedure Laterality Date  . Breast reduction surgery  1994  . Myomectomy  11/2000  . Cesarean section  5/04   History   Social History  . Marital Status: Single    Spouse Name: N/A    Number of Children: N/A  . Years of Education: N/A   Occupational History  . Not on file.   Social History Main Topics  . Smoking status: Never Smoker   . Smokeless tobacco: Never Used  . Alcohol Use: No  . Drug Use: No  . Sexually Active: Yes    Birth Control/ Protection: Pill   Other Topics Concern  . Not on file   Social History Narrative  . No narrative on file   Family History  Problem Relation Age of Onset  . Cancer Mother     leukemia  . Hypertension Father   . Heart disease Maternal Grandmother   . Heart disease Maternal Grandfather   . Aneurysm Maternal Grandfather   . Aneurysm Paternal Grandmother    Patient Active Problem List   Diagnosis Date Noted  . Bronchospasm 12/09/2012  . Dermatitis 12/09/2012  . Acne 03/06/2012  . Single cyst of left breast 04/23/2011  . Allergy   . Polycystic ovarian syndrome   . Hypertension   . Thyroid disease   . Herpes genitalia   . Obesity    Current Outpatient Prescriptions on File Prior to Visit  Medication Sig Dispense Refill  . amLODipine (NORVASC)  10 MG tablet TAKE 1 TABLET DAILY  90 tablet  0  . benazepril (LOTENSIN) 20 MG tablet TAKE 1 TABLET DAILY  90 tablet  0  . levothyroxine (SYNTHROID, LEVOTHROID) 50 MCG tablet TAKE 1 TABLET DAILY  90 tablet  3  . loratadine (CLARITIN) 10 MG tablet Take 10 mg by mouth daily as needed.        . mometasone (ELOCON) 0.1 % cream Apply topically daily.  45 g  0  . phentermine 37.5 MG capsule Take 37.5 mg by mouth every morning.      Marland Kitchen spironolactone (ALDACTONE) 50 MG tablet Take 50 mg by mouth 2 (two) times daily.      Marland Kitchen levonorgestrel-ethinyl estradiol (TRIVORA, 28,) tablet Take 1 tablet by mouth daily.  1 Package  11   No current facility-administered medications on file prior to visit.       Review of Systems See HPI      Objective:   Physical Exam Physical Exam  Nursing note and vitals reviewed.  Constitutional: She is oriented to person, place, and time. She appears well-developed and well-nourished.  HENT:  Head: Normocephalic and atraumatic.  Cardiovascular: Normal rate and regular rhythm. Exam reveals no gallop and no friction rub.  No murmur heard.  Pulmonary/Chest: Breath sounds normal. She has no wheezes. She has no rales.  Neurological: She is alert and oriented to person, place, and time.  Skin: Skin is warm and dry.  Rash behind L knee is almost completely resolved.  She has small maculopapular lesions medial side of knee  Bruise on L knee after hitting knee Psychiatric: She has a normal mood and affect. Her behavior is normal.             Assessment & Plan:  Dermatitis rash.  She is not sure about tick or insect exposure.  Will check RMSF titers and since she is going out of town  Will give Doxycycline Rx 100 mg bid for 10 days.   She is to take antibiotic if she developes fever or headache.  Pt voices understanding .  Continue Elocon creme  If not resolved,  She will need skin biopsy

## 2012-12-21 NOTE — Patient Instructions (Addendum)
Take medicine if headache or fever developes  Make appt with dermatologist

## 2012-12-22 ENCOUNTER — Telehealth: Payer: Self-pay | Admitting: *Deleted

## 2012-12-22 LAB — ROCKY MTN SPOTTED FVR ABS PNL(IGG+IGM)
RMSF IgG: 0.17 IV
RMSF IgM: 0.06 IV

## 2012-12-22 NOTE — Telephone Encounter (Signed)
Message copied by Mathews Robinsons on Tue Dec 22, 2012  4:00 PM ------      Message from: Stephanie [name]      Created: Tue Dec 22, 2012  2:25 PM       Call pt and let her know that her RMSF titers are negative. ------

## 2012-12-22 NOTE — Telephone Encounter (Signed)
Notified pt of negative  RMSF

## 2013-02-08 ENCOUNTER — Encounter: Payer: Self-pay | Admitting: *Deleted

## 2013-03-04 ENCOUNTER — Other Ambulatory Visit: Payer: Self-pay | Admitting: *Deleted

## 2013-03-04 ENCOUNTER — Telehealth: Payer: Self-pay | Admitting: *Deleted

## 2013-03-04 DIAGNOSIS — E079 Disorder of thyroid, unspecified: Secondary | ICD-10-CM

## 2013-03-04 DIAGNOSIS — Z1321 Encounter for screening for nutritional disorder: Secondary | ICD-10-CM

## 2013-03-04 DIAGNOSIS — I10 Essential (primary) hypertension: Secondary | ICD-10-CM

## 2013-03-04 DIAGNOSIS — E669 Obesity, unspecified: Secondary | ICD-10-CM

## 2013-03-04 NOTE — Telephone Encounter (Signed)
Stephanie [name] is needing refill on her synthroid.  It has been since July 2013 since she had any labwork.  Mindy wants to know if she needs to see dr or if she can just come in for labs.  Please call her back and let her know.

## 2013-03-05 ENCOUNTER — Ambulatory Visit (INDEPENDENT_AMBULATORY_CARE_PROVIDER_SITE_OTHER): Payer: 59 | Admitting: *Deleted

## 2013-03-05 DIAGNOSIS — Z23 Encounter for immunization: Secondary | ICD-10-CM

## 2013-03-05 LAB — COMPREHENSIVE METABOLIC PANEL
ALT: 25 U/L (ref 0–35)
AST: 15 U/L (ref 0–37)
Albumin: 3.8 g/dL (ref 3.5–5.2)
Alkaline Phosphatase: 39 U/L (ref 39–117)
BUN: 15 mg/dL (ref 6–23)
CO2: 28 mEq/L (ref 19–32)
Calcium: 9.2 mg/dL (ref 8.4–10.5)
Chloride: 105 mEq/L (ref 96–112)
Creat: 0.66 mg/dL (ref 0.50–1.10)
Glucose, Bld: 79 mg/dL (ref 70–99)
Potassium: 4.4 mEq/L (ref 3.5–5.3)
Sodium: 140 mEq/L (ref 135–145)
Total Bilirubin: 0.3 mg/dL (ref 0.3–1.2)
Total Protein: 6.3 g/dL (ref 6.0–8.3)

## 2013-03-05 LAB — LIPID PANEL
Cholesterol: 174 mg/dL (ref 0–200)
HDL: 57 mg/dL (ref >39)
LDL Cholesterol: 99 mg/dL (ref 0–99)
Total CHOL/HDL Ratio: 3.1 Ratio
Triglycerides: 88 mg/dL (ref <150)
VLDL: 18 mg/dL (ref 0–40)

## 2013-03-05 LAB — CBC WITH DIFFERENTIAL/PLATELET
Basophils Absolute: 0 10*3/uL (ref 0.0–0.1)
Basophils Relative: 0 % (ref 0–1)
Eosinophils Absolute: 0.1 10*3/uL (ref 0.0–0.7)
Eosinophils Relative: 1 % (ref 0–5)
HCT: 38.3 % (ref 36.0–46.0)
Hemoglobin: 13.4 g/dL (ref 12.0–15.0)
Lymphocytes Relative: 34 % (ref 12–46)
Lymphs Abs: 2.9 10*3/uL (ref 0.7–4.0)
MCH: 30.8 pg (ref 26.0–34.0)
MCHC: 35 g/dL (ref 30.0–36.0)
MCV: 88 fL (ref 78.0–100.0)
Monocytes Absolute: 0.5 10*3/uL (ref 0.1–1.0)
Monocytes Relative: 6 % (ref 3–12)
Neutro Abs: 5 10*3/uL (ref 1.7–7.7)
Neutrophils Relative %: 59 % (ref 43–77)
Platelets: 358 10*3/uL (ref 150–400)
RBC: 4.35 MIL/uL (ref 3.87–5.11)
RDW: 12.6 % (ref 11.5–15.5)
WBC: 8.5 10*3/uL (ref 4.0–10.5)

## 2013-03-05 LAB — TSH: TSH: 2.131 u[IU]/mL (ref 0.350–4.500)

## 2013-03-05 NOTE — Addendum Note (Signed)
Addended by: Mathews Robinsons on: 03/05/2013 09:56 AM   Modules accepted: Level of Service

## 2013-03-05 NOTE — Progress Notes (Signed)
Patient ID: Stephanie [name], female   DOB: [birth year], 52 y.o.   MRN: [id number] Stephanie [name] is here today for her annual flu vaccination. VIS given to her. She is feeling well today with no fevers.

## 2013-03-06 LAB — VITAMIN D 25 HYDROXY (VIT D DEFICIENCY, FRACTURES): Vit D, 25-Hydroxy: 43 ng/mL (ref 30–89)

## 2013-03-08 ENCOUNTER — Encounter: Payer: Self-pay | Admitting: *Deleted

## 2013-03-24 ENCOUNTER — Other Ambulatory Visit: Payer: Self-pay | Admitting: Internal Medicine

## 2013-03-25 NOTE — Telephone Encounter (Signed)
Refill request

## 2013-04-07 ENCOUNTER — Inpatient Hospital Stay (HOSPITAL_COMMUNITY): Payer: 59 | Admitting: Anesthesiology

## 2013-04-07 ENCOUNTER — Observation Stay (HOSPITAL_BASED_OUTPATIENT_CLINIC_OR_DEPARTMENT_OTHER)
Admission: EM | Admit: 2013-04-07 | Discharge: 2013-04-08 | Disposition: A | Discharge status: Home / Self Care | Payer: 59 | Attending: Surgery | Admitting: Surgery

## 2013-04-07 ENCOUNTER — Emergency Department (HOSPITAL_BASED_OUTPATIENT_CLINIC_OR_DEPARTMENT_OTHER): Payer: 59

## 2013-04-07 ENCOUNTER — Encounter (HOSPITAL_COMMUNITY): Payer: 59 | Admitting: Anesthesiology

## 2013-04-07 ENCOUNTER — Encounter (HOSPITAL_COMMUNITY): Admission: EM | Disposition: A | Discharge status: Home / Self Care | Payer: Self-pay | Source: Home / Self Care

## 2013-04-07 ENCOUNTER — Encounter (HOSPITAL_BASED_OUTPATIENT_CLINIC_OR_DEPARTMENT_OTHER): Payer: Self-pay | Admitting: Emergency Medicine

## 2013-04-07 DIAGNOSIS — Z79899 Other long term (current) drug therapy: Secondary | ICD-10-CM | POA: Insufficient documentation

## 2013-04-07 DIAGNOSIS — K8010 Calculus of gallbladder with chronic cholecystitis without obstruction: Principal | ICD-10-CM | POA: Insufficient documentation

## 2013-04-07 DIAGNOSIS — E282 Polycystic ovarian syndrome: Secondary | ICD-10-CM | POA: Insufficient documentation

## 2013-04-07 DIAGNOSIS — K8000 Calculus of gallbladder with acute cholecystitis without obstruction: Secondary | ICD-10-CM | POA: Diagnosis present

## 2013-04-07 DIAGNOSIS — K810 Acute cholecystitis: Secondary | ICD-10-CM

## 2013-04-07 DIAGNOSIS — E039 Hypothyroidism, unspecified: Secondary | ICD-10-CM | POA: Insufficient documentation

## 2013-04-07 DIAGNOSIS — A6000 Herpesviral infection of urogenital system, unspecified: Secondary | ICD-10-CM | POA: Insufficient documentation

## 2013-04-07 DIAGNOSIS — K7689 Other specified diseases of liver: Secondary | ICD-10-CM | POA: Insufficient documentation

## 2013-04-07 DIAGNOSIS — R1011 Right upper quadrant pain: Secondary | ICD-10-CM

## 2013-04-07 DIAGNOSIS — I10 Essential (primary) hypertension: Secondary | ICD-10-CM | POA: Insufficient documentation

## 2013-04-07 HISTORY — PX: CHOLECYSTECTOMY: SHX55

## 2013-04-07 HISTORY — DX: Calculus of gallbladder with acute cholecystitis without obstruction: K80.00

## 2013-04-07 HISTORY — PX: LIVER BIOPSY: SHX301

## 2013-04-07 LAB — BASIC METABOLIC PANEL
BUN: 20 mg/dL (ref 6–23)
CO2: 25 mEq/L (ref 19–32)
Calcium: 9.8 mg/dL (ref 8.4–10.5)
Chloride: 98 mEq/L (ref 96–112)
Creatinine, Ser: 0.7 mg/dL (ref 0.50–1.10)
GFR calc Af Amer: >90 mL/min (ref >90)
GFR calc non Af Amer: >90 mL/min (ref >90)
Glucose, Bld: 97 mg/dL (ref 70–99)
Potassium: 3.9 mEq/L (ref 3.5–5.1)
Sodium: 135 mEq/L (ref 135–145)

## 2013-04-07 LAB — CBC
HCT: 42.8 % (ref 36.0–46.0)
Hemoglobin: 14.6 g/dL (ref 12.0–15.0)
MCH: 31.1 pg (ref 26.0–34.0)
MCHC: 34.1 g/dL (ref 30.0–36.0)
MCV: 91.1 fL (ref 78.0–100.0)
Platelets: 327 10*3/uL (ref 150–400)
RBC: 4.7 MIL/uL (ref 3.87–5.11)
RDW: 12.5 % (ref 11.5–15.5)
WBC: 12.3 10*3/uL — ABNORMAL HIGH (ref 4.0–10.5)

## 2013-04-07 LAB — URINALYSIS, ROUTINE W REFLEX MICROSCOPIC
Bilirubin Urine: NEGATIVE
Glucose, UA: NEGATIVE mg/dL
Hgb urine dipstick: NEGATIVE
Ketones, ur: NEGATIVE mg/dL
Leukocytes, UA: NEGATIVE
Nitrite: NEGATIVE
Protein, ur: NEGATIVE mg/dL
Specific Gravity, Urine: 1.021 (ref 1.005–1.030)
Urobilinogen, UA: 0.2 mg/dL (ref 0.0–1.0)
pH: 6 (ref 5.0–8.0)

## 2013-04-07 LAB — HEPATIC FUNCTION PANEL
ALT: 18 U/L (ref 0–35)
AST: 16 U/L (ref 0–37)
Albumin: 4 g/dL (ref 3.5–5.2)
Alkaline Phosphatase: 50 U/L (ref 39–117)
Bilirubin, Direct: <0.1 mg/dL (ref 0.0–0.3)
Total Bilirubin: 0.3 mg/dL (ref 0.3–1.2)
Total Protein: 7.8 g/dL (ref 6.0–8.3)

## 2013-04-07 LAB — TROPONIN I: Troponin I: <0.3 ng/mL (ref <0.30)

## 2013-04-07 LAB — LIPASE, BLOOD: Lipase: 22 U/L (ref 11–59)

## 2013-04-07 LAB — PREGNANCY, URINE: Preg Test, Ur: NEGATIVE

## 2013-04-07 SURGERY — LAPAROSCOPIC CHOLECYSTECTOMY WITH INTRAOPERATIVE CHOLANGIOGRAM
Anesthesia: General | Site: Abdomen | Wound class: Clean Contaminated

## 2013-04-07 MED ORDER — GLYCOPYRROLATE 0.2 MG/ML IJ SOLN
INTRAMUSCULAR | Status: DC | PRN
  Administered 2013-04-07: .6 mg via INTRAVENOUS

## 2013-04-07 MED ORDER — FENTANYL CITRATE 0.05 MG/ML IJ SOLN
25.0000 ug | INTRAMUSCULAR | Status: DC | PRN
  Administered 2013-04-07 (×2): 50 ug via INTRAVENOUS

## 2013-04-07 MED ORDER — METOPROLOL TARTRATE 1 MG/ML IV SOLN
5.0000 mg | Freq: Four times a day (QID) | INTRAVENOUS | Status: DC | PRN
  Filled 2013-04-07: qty 5

## 2013-04-07 MED ORDER — DIETHYLPROPION HCL 25 MG PO TABS: 25.0000 mg | ORAL_TABLET | Freq: Every day | ORAL | Status: DC

## 2013-04-07 MED ORDER — LACTATED RINGERS IV BOLUS (SEPSIS): 1000.0000 mL | Freq: Three times a day (TID) | INTRAVENOUS | Status: DC | PRN

## 2013-04-07 MED ORDER — PROMETHAZINE HCL 25 MG/ML IJ SOLN
12.5000 mg | Freq: Four times a day (QID) | INTRAMUSCULAR | Status: DC | PRN
  Filled 2013-04-07: qty 1

## 2013-04-07 MED ORDER — FENTANYL CITRATE 0.05 MG/ML IJ SOLN
INTRAMUSCULAR | Status: AC
  Filled 2013-04-07: qty 2

## 2013-04-07 MED ORDER — DIPHENHYDRAMINE HCL 12.5 MG/5ML PO ELIX: 12.5000 mg | ORAL_SOLUTION | Freq: Four times a day (QID) | ORAL | Status: DC | PRN

## 2013-04-07 MED ORDER — LACTATED RINGERS IV SOLN
INTRAVENOUS | Status: DC | PRN
  Administered 2013-04-07 (×2): via INTRAVENOUS

## 2013-04-07 MED ORDER — ONDANSETRON HCL 4 MG/2ML IJ SOLN: 4.0000 mg | Freq: Four times a day (QID) | INTRAMUSCULAR | Status: DC | PRN

## 2013-04-07 MED ORDER — SODIUM CHLORIDE 0.9 % IV SOLN
3.0000 g | Freq: Four times a day (QID) | INTRAVENOUS | Status: DC
  Administered 2013-04-07 – 2013-04-08 (×4): 3 g via INTRAVENOUS
  Filled 2013-04-07 (×6): qty 3

## 2013-04-07 MED ORDER — CISATRACURIUM BESYLATE (PF) 10 MG/5ML IV SOLN
INTRAVENOUS | Status: DC | PRN
  Administered 2013-04-07: 6 mg via INTRAVENOUS
  Administered 2013-04-07: 1 mg via INTRAVENOUS

## 2013-04-07 MED ORDER — LIDOCAINE HCL (CARDIAC) 20 MG/ML IV SOLN
INTRAVENOUS | Status: DC | PRN
  Administered 2013-04-07: 100 mg via INTRAVENOUS

## 2013-04-07 MED ORDER — MAGNESIUM HYDROXIDE 400 MG/5ML PO SUSP: 30.0000 mL | Freq: Two times a day (BID) | ORAL | Status: DC | PRN

## 2013-04-07 MED ORDER — LEVONORG-ETH ESTRAD TRIPHASIC PO TABS: 1.0000 | ORAL_TABLET | Freq: Every day | ORAL | Status: DC

## 2013-04-07 MED ORDER — DIPHENHYDRAMINE HCL 50 MG/ML IJ SOLN
12.5000 mg | Freq: Four times a day (QID) | INTRAMUSCULAR | Status: DC | PRN
  Administered 2013-04-07: 12.5 mg via INTRAVENOUS

## 2013-04-07 MED ORDER — LIP MEDEX EX OINT
1.0000 "application " | TOPICAL_OINTMENT | Freq: Two times a day (BID) | CUTANEOUS | Status: DC
  Administered 2013-04-07: 1 via TOPICAL
  Filled 2013-04-07: qty 7

## 2013-04-07 MED ORDER — SODIUM CHLORIDE 0.9 % IV SOLN
INTRAVENOUS | Status: AC
  Filled 2013-04-07: qty 3

## 2013-04-07 MED ORDER — DIPHENHYDRAMINE HCL 50 MG/ML IJ SOLN
INTRAMUSCULAR | Status: AC
  Filled 2013-04-07: qty 1

## 2013-04-07 MED ORDER — PROPOFOL 10 MG/ML IV BOLUS
INTRAVENOUS | Status: DC | PRN
  Administered 2013-04-07: 200 mg via INTRAVENOUS

## 2013-04-07 MED ORDER — SPIRONOLACTONE 50 MG PO TABS
50.0000 mg | ORAL_TABLET | Freq: Two times a day (BID) | ORAL | Status: DC
  Administered 2013-04-07 – 2013-04-08 (×2): 50 mg via ORAL
  Filled 2013-04-07 (×4): qty 1

## 2013-04-07 MED ORDER — BISACODYL 10 MG RE SUPP: 10.0000 mg | Freq: Two times a day (BID) | RECTAL | Status: DC | PRN

## 2013-04-07 MED ORDER — LACTATED RINGERS IV SOLN
INTRAVENOUS | Status: DC
  Administered 2013-04-07: 18:00:00 via INTRAVENOUS

## 2013-04-07 MED ORDER — PHENTERMINE HCL 37.5 MG PO CAPS: 37.5000 mg | ORAL_CAPSULE | Freq: Every day | ORAL | Status: DC

## 2013-04-07 MED ORDER — FENTANYL CITRATE 0.05 MG/ML IJ SOLN
INTRAMUSCULAR | Status: DC | PRN
  Administered 2013-04-07 (×2): 50 ug via INTRAVENOUS
  Administered 2013-04-07: 150 ug via INTRAVENOUS

## 2013-04-07 MED ORDER — GI COCKTAIL ~~LOC~~
30.0000 mL | Freq: Once | ORAL | Status: AC
  Administered 2013-04-07: 30 mL via ORAL
  Filled 2013-04-07: qty 30

## 2013-04-07 MED ORDER — NEOSTIGMINE METHYLSULFATE 1 MG/ML IJ SOLN
INTRAMUSCULAR | Status: DC | PRN
  Administered 2013-04-07: 4 mg via INTRAVENOUS

## 2013-04-07 MED ORDER — PROMETHAZINE HCL 25 MG/ML IJ SOLN
INTRAMUSCULAR | Status: AC
  Filled 2013-04-07: qty 1

## 2013-04-07 MED ORDER — ACETAMINOPHEN 650 MG RE SUPP: 650.0000 mg | Freq: Four times a day (QID) | RECTAL | Status: DC | PRN

## 2013-04-07 MED ORDER — 0.9 % SODIUM CHLORIDE (POUR BTL) OPTIME
TOPICAL | Status: DC | PRN
  Administered 2013-04-07: 1000 mL

## 2013-04-07 MED ORDER — MIDAZOLAM HCL 5 MG/5ML IJ SOLN
INTRAMUSCULAR | Status: DC | PRN
  Administered 2013-04-07: 2 mg via INTRAVENOUS

## 2013-04-07 MED ORDER — ACETAMINOPHEN 325 MG PO TABS: 650.0000 mg | ORAL_TABLET | Freq: Four times a day (QID) | ORAL | Status: DC | PRN

## 2013-04-07 MED ORDER — LEVOTHYROXINE SODIUM 50 MCG PO TABS
50.0000 ug | ORAL_TABLET | Freq: Every day | ORAL | Status: DC
  Administered 2013-04-08: 50 ug via ORAL
  Filled 2013-04-07 (×2): qty 1

## 2013-04-07 MED ORDER — DEXAMETHASONE SODIUM PHOSPHATE 10 MG/ML IJ SOLN
INTRAMUSCULAR | Status: DC | PRN
  Administered 2013-04-07: 10 mg via INTRAVENOUS

## 2013-04-07 MED ORDER — OXYCODONE HCL 5 MG PO TABS
5.0000 mg | ORAL_TABLET | ORAL | Status: DC | PRN
  Administered 2013-04-08: 10 mg via ORAL
  Administered 2013-04-08 (×2): 5 mg via ORAL
  Filled 2013-04-07: qty 1
  Filled 2013-04-07: qty 2
  Filled 2013-04-07: qty 1

## 2013-04-07 MED ORDER — MAGIC MOUTHWASH
15.0000 mL | Freq: Four times a day (QID) | ORAL | Status: DC | PRN
  Filled 2013-04-07: qty 15

## 2013-04-07 MED ORDER — SACCHAROMYCES BOULARDII 250 MG PO CAPS
250.0000 mg | ORAL_CAPSULE | Freq: Two times a day (BID) | ORAL | Status: DC
  Administered 2013-04-07 – 2013-04-08 (×2): 250 mg via ORAL
  Filled 2013-04-07 (×3): qty 1

## 2013-04-07 MED ORDER — PSYLLIUM 95 % PO PACK
1.0000 | PACK | Freq: Two times a day (BID) | ORAL | Status: DC
  Administered 2013-04-07 – 2013-04-08 (×2): 1 via ORAL
  Filled 2013-04-07 (×3): qty 1

## 2013-04-07 MED ORDER — ZOLPIDEM TARTRATE 5 MG PO TABS: 5.0000 mg | ORAL_TABLET | Freq: Every evening | ORAL | Status: DC | PRN

## 2013-04-07 MED ORDER — ALUM & MAG HYDROXIDE-SIMETH 200-200-20 MG/5ML PO SUSP: 30.0000 mL | Freq: Four times a day (QID) | ORAL | Status: DC | PRN

## 2013-04-07 MED ORDER — OXYCODONE HCL 5 MG PO TABS: 5.0000 mg | ORAL_TABLET | ORAL | Status: DC | PRN

## 2013-04-07 MED ORDER — HYDROMORPHONE HCL PF 1 MG/ML IJ SOLN: 0.5000 mg | INTRAMUSCULAR | Status: DC | PRN

## 2013-04-07 MED ORDER — ACETAMINOPHEN 10 MG/ML IV SOLN
1000.0000 mg | Freq: Once | INTRAVENOUS | Status: AC
  Administered 2013-04-07: 1000 mg via INTRAVENOUS
  Filled 2013-04-07: qty 100

## 2013-04-07 MED ORDER — AMLODIPINE BESYLATE 10 MG PO TABS
10.0000 mg | ORAL_TABLET | Freq: Every day | ORAL | Status: DC
  Administered 2013-04-08: 10 mg via ORAL
  Filled 2013-04-07 (×2): qty 1

## 2013-04-07 MED ORDER — BUPIVACAINE-EPINEPHRINE 0.25% -1:200000 IJ SOLN
INTRAMUSCULAR | Status: DC | PRN
  Administered 2013-04-07: 50 mL

## 2013-04-07 MED ORDER — PANTOPRAZOLE SODIUM 40 MG PO TBEC
40.0000 mg | DELAYED_RELEASE_TABLET | Freq: Once | ORAL | Status: AC
  Administered 2013-04-07: 40 mg via ORAL
  Filled 2013-04-07: qty 1

## 2013-04-07 MED ORDER — ACETAMINOPHEN 500 MG PO TABS
1000.0000 mg | ORAL_TABLET | Freq: Three times a day (TID) | ORAL | Status: DC
  Administered 2013-04-07 – 2013-04-08 (×2): 1000 mg via ORAL
  Filled 2013-04-07 (×4): qty 2

## 2013-04-07 MED ORDER — PROMETHAZINE HCL 25 MG/ML IJ SOLN
6.2500 mg | INTRAMUSCULAR | Status: AC | PRN
  Administered 2013-04-07 (×2): 12.5 mg via INTRAVENOUS

## 2013-04-07 MED ORDER — KCL IN DEXTROSE-NACL 40-5-0.45 MEQ/L-%-% IV SOLN: INTRAVENOUS | Status: DC

## 2013-04-07 MED ORDER — BENAZEPRIL HCL 20 MG PO TABS
20.0000 mg | ORAL_TABLET | Freq: Every day | ORAL | Status: DC
  Administered 2013-04-08: 20 mg via ORAL
  Filled 2013-04-07: qty 1

## 2013-04-07 MED ORDER — ONDANSETRON HCL 4 MG/2ML IJ SOLN
INTRAMUSCULAR | Status: DC | PRN
  Administered 2013-04-07: 4 mg via INTRAVENOUS

## 2013-04-07 MED ORDER — HYDROMORPHONE HCL PF 1 MG/ML IJ SOLN
1.0000 mg | Freq: Once | INTRAMUSCULAR | Status: AC
  Administered 2013-04-07: 1 mg via INTRAVENOUS
  Filled 2013-04-07: qty 1

## 2013-04-07 MED ORDER — LORATADINE 10 MG PO TABS
10.0000 mg | ORAL_TABLET | Freq: Every day | ORAL | Status: DC | PRN
  Filled 2013-04-07: qty 1

## 2013-04-07 MED ORDER — KCL IN DEXTROSE-NACL 40-5-0.45 MEQ/L-%-% IV SOLN
INTRAVENOUS | Status: DC
  Administered 2013-04-07 – 2013-04-08 (×2): 75 mL/h via INTRAVENOUS
  Filled 2013-04-07 (×2): qty 1000

## 2013-04-07 MED ORDER — FENTANYL CITRATE 0.05 MG/ML IJ SOLN
25.0000 ug | INTRAMUSCULAR | Status: DC | PRN
  Administered 2013-04-07: 25 ug via INTRAVENOUS
  Filled 2013-04-07: qty 2

## 2013-04-07 MED ORDER — HEPARIN SODIUM (PORCINE) 5000 UNIT/ML IJ SOLN
5000.0000 [IU] | Freq: Three times a day (TID) | INTRAMUSCULAR | Status: DC
  Administered 2013-04-08: 5000 [IU] via SUBCUTANEOUS
  Filled 2013-04-07 (×4): qty 1

## 2013-04-07 MED ORDER — SUCCINYLCHOLINE CHLORIDE 20 MG/ML IJ SOLN
INTRAMUSCULAR | Status: DC | PRN
  Administered 2013-04-07: 80 mg via INTRAVENOUS

## 2013-04-07 MED ORDER — LACTATED RINGERS IR SOLN
Status: DC | PRN
  Administered 2013-04-07: 1500 mL

## 2013-04-07 SURGICAL SUPPLY — 41 items
APPLIER CLIP 5 13 M/L LIGAMAX5 (MISCELLANEOUS)
APPLIER CLIP ROT 10 11.4 M/L (STAPLE) ×2
APR CLP MED LRG 11.4X10 (STAPLE) ×1
APR CLP MED LRG 5 ANG JAW (MISCELLANEOUS)
BAG SPEC RTRVL LRG 6X4 10 (ENDOMECHANICALS) ×1
CABLE HIGH FREQUENCY MONO STRZ (ELECTRODE) IMPLANT
CANISTER SUCTION 2500CC (MISCELLANEOUS) ×2 IMPLANT
CLIP APPLIE 5 13 M/L LIGAMAX5 (MISCELLANEOUS) IMPLANT
CLIP APPLIE ROT 10 11.4 M/L (STAPLE) IMPLANT
COVER MAYO STAND STRL (DRAPES) ×2 IMPLANT
DECANTER SPIKE VIAL GLASS SM (MISCELLANEOUS) ×2 IMPLANT
DRAPE C-ARM 42X120 X-RAY (DRAPES) ×2 IMPLANT
DRAPE LAPAROSCOPIC ABDOMINAL (DRAPES) ×2 IMPLANT
DRAPE UTILITY XL STRL (DRAPES) ×2 IMPLANT
DRAPE WARM FLUID 44X44 (DRAPE) ×2 IMPLANT
DRESSING TELFA 8X3 (GAUZE/BANDAGES/DRESSINGS) ×1 IMPLANT
DRSG TEGADERM 2-3/8X2-3/4 SM (GAUZE/BANDAGES/DRESSINGS) ×3 IMPLANT
DRSG TEGADERM 4X4.75 (GAUZE/BANDAGES/DRESSINGS) IMPLANT
ELECT REM PT RETURN 9FT ADLT (ELECTROSURGICAL) ×2
ELECTRODE REM PT RTRN 9FT ADLT (ELECTROSURGICAL) ×1 IMPLANT
ENDOLOOP SUT PDS II  0 18 (SUTURE) ×1
ENDOLOOP SUT PDS II 0 18 (SUTURE) IMPLANT
GLOVE ECLIPSE 8.0 STRL XLNG CF (GLOVE) ×2 IMPLANT
GLOVE INDICATOR 8.0 STRL GRN (GLOVE) ×2 IMPLANT
GOWN STRL REIN XL XLG (GOWN DISPOSABLE) ×6 IMPLANT
KIT BASIN OR (CUSTOM PROCEDURE TRAY) ×2 IMPLANT
NDL BIOPSY 14X6 SOFT TISS (NEEDLE) IMPLANT
NEEDLE BIOPSY 14X6 SOFT TISS (NEEDLE) ×2 IMPLANT
POUCH SPECIMEN RETRIEVAL 10MM (ENDOMECHANICALS) ×2 IMPLANT
SCISSORS LAP 5X35 DISP (ENDOMECHANICALS) ×2 IMPLANT
SET CHOLANGIOGRAPH MIX (MISCELLANEOUS) ×2 IMPLANT
SET IRRIG TUBING LAPAROSCOPIC (IRRIGATION / IRRIGATOR) ×2 IMPLANT
SLEEVE XCEL OPT CAN 5 100 (ENDOMECHANICALS) ×2 IMPLANT
SUT MNCRL AB 4-0 PS2 18 (SUTURE) ×2 IMPLANT
SYR 20CC LL (SYRINGE) ×2 IMPLANT
TOWEL OR 17X26 10 PK STRL BLUE (TOWEL DISPOSABLE) ×2 IMPLANT
TOWEL OR NON WOVEN STRL DISP B (DISPOSABLE) ×2 IMPLANT
TRAY LAP CHOLE (CUSTOM PROCEDURE TRAY) ×2 IMPLANT
TROCAR BLADELESS OPT 5 100 (ENDOMECHANICALS) ×2 IMPLANT
TROCAR XCEL NON-BLD 11X100MML (ENDOMECHANICALS) ×2 IMPLANT
TUBING INSUFFLATION 10FT LAP (TUBING) ×2 IMPLANT

## 2013-04-07 NOTE — ED Provider Notes (Signed)
CSN: [id number]     Arrival date & time 04/07/13  5784 History   First MD Initiated Contact with Patient 04/07/13 0805     Chief Complaint  Patient presents with  . pressure in upper abdomen and arms    (Consider location/radiation/quality/duration/timing/severity/associated sxs/prior Treatment) Patient is a 52 y.o. female presenting with abdominal pain.  Abdominal Pain Pain location:  RUQ and epigastric Pain quality: pressure   Pain radiates to:  Chest Pain severity:  Severe Onset quality:  Sudden Timing:  Constant Progression:  Unchanged Chronicity:  New Context: eating   Relieved by: certain positions. Ineffective treatments:  Eating Associated symptoms: nausea   Associated symptoms: no chest pain, no cough, no diarrhea, no fever, no shortness of breath and no vomiting     Past Medical History  Diagnosis Date  . Polycystic ovarian syndrome   . Allergy   . Hypertension   . Thyroid disease   . Obesity   . Herpes genitalia    Past Surgical History  Procedure Laterality Date  . Breast reduction surgery  1994  . Myomectomy  11/2000  . Cesarean section  5/04   Family History  Problem Relation Age of Onset  . Cancer Mother     leukemia  . Hypertension Father   . Heart disease Maternal Grandmother   . Heart disease Maternal Grandfather   . Aneurysm Maternal Grandfather   . Aneurysm Paternal Grandmother    History  Substance Use Topics  . Smoking status: Never Smoker   . Smokeless tobacco: Never Used  . Alcohol Use: No   OB History   Grav Para Term Preterm Abortions TAB SAB Ect Mult Living   1 1             Review of Systems  Constitutional: Negative for fever.  HENT: Negative for congestion.   Respiratory: Negative for cough and shortness of breath.   Cardiovascular: Negative for chest pain.  Gastrointestinal: Positive for nausea and abdominal pain. Negative for vomiting and diarrhea.  All other systems reviewed and are negative.    Allergies  Review  of patient's allergies indicates no known allergies.  Home Medications   Current Outpatient Rx  Name  Route  Sig  Dispense  Refill  . amLODipine (NORVASC) 10 MG tablet   Oral   Take 10 mg by mouth daily.         . benazepril (LOTENSIN) 20 MG tablet   Oral   Take 20 mg by mouth daily.         . Diethylpropion HCl 25 MG TABS   Oral   Take 25 mg by mouth daily.          Marland Kitchen levonorgestrel-ethinyl estradiol (TRIVORA, 28,) tablet   Oral   Take 1 tablet by mouth daily.   1 Package   11   . levothyroxine (SYNTHROID, LEVOTHROID) 50 MCG tablet   Oral   Take 50 mcg by mouth daily before breakfast.         . loratadine (CLARITIN) 10 MG tablet   Oral   Take 10 mg by mouth daily as needed for allergies.          . phentermine 37.5 MG capsule   Oral   Take 37.5 mg by mouth every morning.         Marland Kitchen spironolactone (ALDACTONE) 50 MG tablet   Oral   Take 50 mg by mouth 2 (two) times daily.  BP 106/65  Pulse 74  Temp(Src) 98.5 F (36.9 C) (Oral)  Resp 16  SpO2 99%  LMP 03/24/2013 Physical Exam  Nursing note and vitals reviewed. Constitutional: She is oriented to person, place, and time. She appears well-developed and well-nourished. No distress.  HENT:  Head: Normocephalic and atraumatic.  Mouth/Throat: Oropharynx is clear and moist.  Eyes: Conjunctivae are normal. Pupils are equal, round, and reactive to light. No scleral icterus.  Neck: Neck supple.  Cardiovascular: Normal rate, regular rhythm, normal heart sounds and intact distal pulses.   No murmur heard. Pulmonary/Chest: Effort normal and breath sounds normal. No stridor. No respiratory distress. She has no rales.  Abdominal: Soft. Bowel sounds are normal. She exhibits no distension. There is tenderness in the right upper quadrant. There is positive Murphy's sign. There is no rigidity, no rebound and no guarding.  Musculoskeletal: Normal range of motion.  Neurological: She is alert and oriented to  person, place, and time.  Skin: Skin is warm and dry. No rash noted.  Psychiatric: She has a normal mood and affect. Her behavior is normal.    ED Course  Procedures (including critical care time) Labs Review Labs Reviewed  CBC - Abnormal; Notable for the following:    WBC 12.3 (*)    All other components within normal limits  URINALYSIS, ROUTINE W REFLEX MICROSCOPIC  PREGNANCY, URINE  BASIC METABOLIC PANEL  HEPATIC FUNCTION PANEL  LIPASE, BLOOD  TROPONIN I   Imaging Review Dg Chest 2 View  04/07/2013   CLINICAL DATA:  epigastric region pain  EXAM: CHEST  2 VIEW  COMPARISON:  Oct 29, 2012  FINDINGS: There is no edema or consolidation. Heart size and pulmonary vascularity are normal. No adenopathy. No pneumothorax. There is slight degenerative change in the thoracic spine.  IMPRESSION: No edema or consolidation.   Electronically Signed   By: Bretta Bang M.D.   On: 04/07/2013 09:07   US Abdomen Complete  04/07/2013   CLINICAL DATA:  Abdominal pain and nausea with right upper quadrant tenderness. Marland Kitchen  EXAM: ULTRASOUND ABDOMEN COMPLETE  COMPARISON:  None  FINDINGS: Gallbladder  The gallbladder exhibits multiple echogenic mobile shadowing stones. There is a positive sonographic Murphy's sign. The gallbladder wall measures 3 mm in thickness. No pericholecystic fluid is demonstrated.  Common bile duct  Diameter: The common bile duct measures 4 mm in diameter. No intraluminal echoes are demonstrated.  Liver  The liver exhibits normal echotexture with no focal mass nor ductal dilation.  IVC  Evaluation of the IVC is limited by bowel gas.  Pancreas  Bowel gas limits evaluation of the pancreas.  Spleen  The spleen is normal in echotexture and measures 8 female cm in greatest dimension.  Right Kidney  Length: 12.9 cm. There is mild splitting of the central echo complex  Left Kidney  Length: 12 cm there is mild splitting of the central echo complex.  Abdominal aorta  The abdominal aorta could be only  partially imaged due to bowel gas. The maximal measured dimension is 2.7 cm.  IMPRESSION: 1. There are multiple mobile gallstones. There is a positive sonographic Murphy's sign. No gallbladder wall thickening or pericholecystic fluid is demonstrated. The findings likely reflect acute cholecystitis accompanying cholelithiasis. 2. There is minimal dilation of the common bowel duct to 4 mm. 3 mm is degenerated upper limits jail accepted upper limits of normal 3. Minimal hydronephrosis bilaterally is suspected.   Electronically Signed   By: David  [place]   On: 04/07/2013 09:13  EKG Interpretation     Ventricular Rate:  79 PR Interval:  156 QRS Duration: 88 QT Interval:  386 QTC Calculation: 442 R Axis:   -5 Text Interpretation:  Normal sinus rhythm Normal ECG No prior studies            MDM   1. Right upper quadrant pain   2. Cholecystitis, acute    53 yo female transferred from [facility] found to have cholecystitis.  Dr. Michaell Cowing will operate.  Pt stable in ED and will be admitted and taken to OR shortly.      Candyce Churn, MD 04/07/13 1452

## 2013-04-07 NOTE — Anesthesia Procedure Notes (Signed)
Procedure Name: Intubation Date/Time: 04/07/2013 3:35 PM Performed by: Leroy Libman L Patient Re-evaluated:Patient Re-evaluated prior to inductionOxygen Delivery Method: Circle system utilized Preoxygenation: Pre-oxygenation with 100% oxygen Intubation Type: IV induction Ventilation: Mask ventilation without difficulty and Oral airway inserted - appropriate to patient size Laryngoscope Size: Miller and 2 Grade View: Grade I Tube type: Oral Number of attempts: 1 Airway Equipment and Method: Stylet Placement Confirmation: ETT inserted through vocal cords under direct vision,  breath sounds checked- equal and bilateral and positive ETCO2 Secured at: 21 cm Tube secured with: Tape Dental Injury: Teeth and Oropharynx as per pre-operative assessment

## 2013-04-07 NOTE — Progress Notes (Signed)
Patient with classic story of biliary colic that is now constant since 4 AM. Positive Murphy sign. Ultrasound concerning for cholecystitis.  I recommended IV fluids, IV antibiotics, and cholecystectomy:  The anatomy & physiology of hepatobiliary & pancreatic function was discussed.  The pathophysiology of gallbladder dysfunction was discussed.  Natural history risks without surgery was discussed.   I feel the risks of no intervention will lead to serious problems that outweigh the operative risks; therefore, I recommended cholecystectomy to remove the pathology.  I explained laparoscopic techniques with possible need for an open approach.  Probable cholangiogram to evaluate the bilary tract was explained as well.    Risks such as bleeding, infection, abscess, leak, injury to other organs, need for further treatment, heart attack, death, and other risks were discussed.  I noted a good likelihood this will help address the problem.  Possibility that this will not correct all abdominal symptoms was explained.  Goals of post-operative recovery were discussed as well.  We will work to minimize complications.  An educational handout further explaining the pathology and treatment options was given as well.  Questions were answered.  The patient expresses understanding & wishes to proceed with surgery.

## 2013-04-07 NOTE — Op Note (Addendum)
04/07/2013  4:35 PM  PATIENT:  Stephanie [name]  52 y.o. female  Patient Care Team: Kendrick Ranch, MD as PCP - General (Internal Medicine)  PRE-OPERATIVE DIAGNOSIS:  Acute cholecystitis  POST-OPERATIVE DIAGNOSIS:    Acute cholecystitis  Cholecystolithiasis  Fatty steatohepatitis. Possible early cirrhosis.  PROCEDURE:  Procedure(s): LAPAROSCOPIC CHOLECYSTECTOMY LIVER BIOPSY  SURGEON:  Surgeon(s): Ardeth Sportsman, MD  ASSISTANT: Bhavinkumar "Vin" Bhagat, PA student, [employer]   ANESTHESIA:   local and general  EBL:  Total I/O In: 1000 [I.V.:1000] Out: 200 [Blood:200]  Delay start of Pharmacological VTE agent (>24hrs) due to surgical blood loss or risk of bleeding:  no  DRAINS: none   SPECIMEN:  Source of Specimen:  1.  Gallbladder  2. Liver biopsy  DISPOSITION OF SPECIMEN:  PATHOLOGY  COUNTS:  YES  PLAN OF CARE: Admit to inpatient   PATIENT DISPOSITION:  PACU - hemodynamically stable.  INDICATION: Obese female with episode of biliary colic that became constant. Nausea or vomiting. Concern for cholecystitis on radiographic and physical examination. I recommended lap cholecystectomy:  The anatomy & physiology of hepatobiliary & pancreatic function was discussed.  The pathophysiology of gallbladder dysfunction was discussed.  Natural history risks without surgery was discussed.   I feel the risks of no intervention will lead to serious problems that outweigh the operative risks; therefore, I recommended cholecystectomy to remove the pathology.  I explained laparoscopic techniques with possible need for an open approach.  Probable cholangiogram to evaluate the bilary tract was explained as well.    Risks such as bleeding, infection, abscess, leak, injury to other organs, need for further treatment, heart attack, death, and other risks were discussed.  I noted a good likelihood this will help address the problem.  Possibility that this will not correct all  abdominal symptoms was explained.  Goals of post-operative recovery were discussed as well.  We will work to minimize complications.  An educational handout further explaining the pathology and treatment options was given as well.  Questions were answered.  The patient expresses understanding & wishes to proceed with surgery.  OR FINDINGS: She had gallbladder off-again omental adhesions and strongly suspicious for acute on chronic cholecystitis. Rather edematous.  She had a stiff liver with early fatty change within it.  Bled easily.  DESCRIPTION:   The patient was identified & brought into the operating room. The patient was positioned supine with arms tucked. SCDs were active during the entire case. The patient underwent general anesthesia without any difficulty.  The abdomen was prepped and draped in a sterile fashion. A Surgical Timeout confirmed our plan.  We positioned the patient in reverse [place] & right side up.  I placed a 5mm laparoscopic port through the abdominal wall using optical entry technique in the right upper quadrant.  Entry was clean.  We induced carbon dioxide insufflation. Camera inspection revealed no injury. There were no adhesions to the anterior abdominal wall in the right upper quadrant, There were some noted in the left upper quadrant and infraumbilically. Omentum only. I chose to leave those alone..  I proceeded to continue with laparoscopic technique. I placed a #5 port in supraumbilical region, another 5mm port in the right flank near the anterior axillary line, and a 10mm port in the left subxiphoid region obliquely within the falciform ligament.  I turned attention to the right upper quadrant.  The gallbladder fundus was elevated cephalad.   There were moderate omental adhesions to the gallbladder and the liver. I  carefully freed this off using focused blood Hydro dissection and hook cautery.  I used hook cautery to free the peritoneal coverings between the  gallbladder and the liver on the posteriolateral and anteriomedial walls.   I used careful blunt and hook dissection to help get a good critical view of the cystic artery and cystic duct. I did further dissection to free a few centimeters of the  gallbladder off the liver bed to get a good critical view of the infundibulum and cystic duct. I mobilized the cystic artery; and, after getting a good 360 view, ligated the cystic artery using clips. There were somewhat enlarged and friable. Some mild bleeding one branch that was controlled with pressure and clips. The hepatic artery could be seen in its torturous path and we stayed away from that.  I skeletonized the cystic duct.  I placed a clip on the infundibulum. I did a partial cystic duct-otomy and ensured patency.  I could never obtain a good lumen. The cystic duct was friable and fell apart. I aborted cholangiogram.  I placed a 0 PDS Endoloop around the cystic duct stump.  I placed clips on the cystic duct x2.  I freed the gallbladder from its remaining attachments to the liver. I ensured hemostasis on the gallbladder fossa of the liver and elsewhere. The liver was somewhat friable but eventually we got control that. Because the liver was rather stiff and with significant fatty change and bled easily, I decided to perform core liver biopsy.   I used a 14-gauge needle along the right subcostal edge did biopsies of the anterior right hepatic lobe. I got 3 excellent cores and assured hemostasis. I inspected the rest of the abdomen & detected no injury nor bleeding elsewhere.  I removed the gallbladder. I closed the subxiphoid fascia transversely using 0 Vicryl interrupted stitches.  We closed the skin using 4-0 monocryl stitch.  Sterile dressings were applied. The patient was extubated & arrived in the PACU in stable condition..  I had discussed postoperative care with the patient in the holding area.  We looked to try and find family but none was available.   Instructions are written in the chart as well.

## 2013-04-07 NOTE — ED Notes (Signed)
MD at bedside. 

## 2013-04-07 NOTE — H&P (Signed)
See other note.  Patient with biliary colic now constant pain. Murphy sign. Suspicious for cholecystitis. I recommended urgent cholecystectomy. Questions answered. She agrees.

## 2013-04-07 NOTE — ED Notes (Addendum)
Family at bedside.  Pt stable and transported to [facility] ED via POV and stable upon transfer.

## 2013-04-07 NOTE — ED Notes (Signed)
Pt states she is having pressure and fullness across entire upper abdomen has episodes of becoming sweaty and nauseated but passed quickly. Concerned it could be cardiac although she has no personal history of same. Pt states same happened several days ago and the common factor is she made beef stew and both times this occurred she had just eaten the beef stew.

## 2013-04-07 NOTE — ED Provider Notes (Signed)
CSN: [id number]     Arrival date & time 04/07/13  7829 History   First MD Initiated Contact with Patient 04/07/13 0805     Chief Complaint  Patient presents with  . pressure in upper abdomen and arms    (Consider location/radiation/quality/duration/timing/severity/associated sxs/prior Treatment) Patient is a 52 y.o. female presenting with abdominal pain. The history is provided by the patient.  Abdominal Pain Pain location:  RUQ and epigastric Pain quality: pressure   Pain radiates to:  Chest Pain severity:  Severe Onset quality:  Sudden Timing:  Constant Progression:  Unchanged Chronicity:  Recurrent Context: eating (beef stew)   Relieved by: bending over. Worsened by:  Nothing tried Associated symptoms: anorexia, chest pain (chest pressure, radiating from upper abdomen), fatigue and nausea   Associated symptoms: no cough, no diarrhea, no fever, no shortness of breath and no vomiting     Past Medical History  Diagnosis Date  . Polycystic ovarian syndrome   . Allergy   . Hypertension   . Thyroid disease   . Obesity   . Herpes genitalia    Past Surgical History  Procedure Laterality Date  . Breast reduction surgery  1994  . Myomectomy  11/2000  . Cesarean section  5/04   Family History  Problem Relation Age of Onset  . Cancer Mother     leukemia  . Hypertension Father   . Heart disease Maternal Grandmother   . Heart disease Maternal Grandfather   . Aneurysm Maternal Grandfather   . Aneurysm Paternal Grandmother    History  Substance Use Topics  . Smoking status: Never Smoker   . Smokeless tobacco: Never Used  . Alcohol Use: No   OB History   Grav Para Term Preterm Abortions TAB SAB Ect Mult Living   1 1             Review of Systems  Constitutional: Positive for fatigue. Negative for fever.  Respiratory: Negative for cough and shortness of breath.   Cardiovascular: Positive for chest pain (chest pressure, radiating from upper abdomen). Negative for leg  swelling.  Gastrointestinal: Positive for nausea, abdominal pain and anorexia. Negative for vomiting and diarrhea.  All other systems reviewed and are negative.    Allergies  Review of patient's allergies indicates no known allergies.  Home Medications   Current Outpatient Rx  Name  Route  Sig  Dispense  Refill  . amLODipine (NORVASC) 10 MG tablet      TAKE 1 TABLET DAILY   90 tablet   3   . benazepril (LOTENSIN) 20 MG tablet      TAKE 1 TABLET DAILY   90 tablet   1   . doxycycline (VIBRA-TABS) 100 MG tablet   Oral   Take 1 tablet (100 mg total) by mouth 2 (two) times daily.   20 tablet   0   . levonorgestrel-ethinyl estradiol (TRIVORA, 28,) tablet   Oral   Take 1 tablet by mouth daily.   1 Package   11   . levothyroxine (SYNTHROID, LEVOTHROID) 50 MCG tablet      TAKE 1 TABLET DAILY   90 tablet   3   . loratadine (CLARITIN) 10 MG tablet   Oral   Take 10 mg by mouth daily as needed.           . mometasone (ELOCON) 0.1 % cream   Topical   Apply topically daily.   45 g   0   . phentermine 37.5 MG capsule  Oral   Take 37.5 mg by mouth every morning.         Marland Kitchen spironolactone (ALDACTONE) 50 MG tablet   Oral   Take 50 mg by mouth 2 (two) times daily.          LMP 03/24/2013 Physical Exam  Nursing note and vitals reviewed. Constitutional: She is oriented to person, place, and time. She appears well-developed and well-nourished. No distress.  HENT:  Head: Normocephalic and atraumatic.  Eyes: EOM are normal. Pupils are equal, round, and reactive to light.  Neck: Normal range of motion. Neck supple.  Cardiovascular: Normal rate and regular rhythm.  Exam reveals no friction rub.   No murmur heard. Pulmonary/Chest: Effort normal and breath sounds normal. No respiratory distress. She has no wheezes. She has no rales.  Abdominal: Soft. She exhibits no distension and no mass. There is tenderness (epigastrum, RUQ). There is no rebound and no guarding.   Musculoskeletal: Normal range of motion. She exhibits no edema.  Neurological: She is alert and oriented to person, place, and time.  Skin: She is not diaphoretic.    ED Course  Procedures (including critical care time) Labs Review Labs Reviewed  CBC - Abnormal; Notable for the following:    WBC 12.3 (*)    All other components within normal limits  URINALYSIS, ROUTINE W REFLEX MICROSCOPIC  PREGNANCY, URINE  BASIC METABOLIC PANEL  HEPATIC FUNCTION PANEL  LIPASE, BLOOD  TROPONIN I   Imaging Review Dg Chest 2 View  04/07/2013   CLINICAL DATA:  epigastric region pain  EXAM: CHEST  2 VIEW  COMPARISON:  Oct 29, 2012  FINDINGS: There is no edema or consolidation. Heart size and pulmonary vascularity are normal. No adenopathy. No pneumothorax. There is slight degenerative change in the thoracic spine.  IMPRESSION: No edema or consolidation.   Electronically Signed   By: Bretta Bang M.D.   On: 04/07/2013 09:07   US Abdomen Complete  04/07/2013   CLINICAL DATA:  Abdominal pain and nausea with right upper quadrant tenderness. Marland Kitchen  EXAM: ULTRASOUND ABDOMEN COMPLETE  COMPARISON:  None  FINDINGS: Gallbladder  The gallbladder exhibits multiple echogenic mobile shadowing stones. There is a positive sonographic Murphy's sign. The gallbladder wall measures 3 mm in thickness. No pericholecystic fluid is demonstrated.  Common bile duct  Diameter: The common bile duct measures 4 mm in diameter. No intraluminal echoes are demonstrated.  Liver  The liver exhibits normal echotexture with no focal mass nor ductal dilation.  IVC  Evaluation of the IVC is limited by bowel gas.  Pancreas  Bowel gas limits evaluation of the pancreas.  Spleen  The spleen is normal in echotexture and measures 8 female cm in greatest dimension.  Right Kidney  Length: 12.9 cm. There is mild splitting of the central echo complex  Left Kidney  Length: 12 cm there is mild splitting of the central echo complex.  Abdominal aorta  The  abdominal aorta could be only partially imaged due to bowel gas. The maximal measured dimension is 2.7 cm.  IMPRESSION: 1. There are multiple mobile gallstones. There is a positive sonographic Murphy's sign. No gallbladder wall thickening or pericholecystic fluid is demonstrated. The findings likely reflect acute cholecystitis accompanying cholelithiasis. 2. There is minimal dilation of the common bowel duct to 4 mm. 3 mm is degenerated upper limits jail accepted upper limits of normal 3. Minimal hydronephrosis bilaterally is suspected.   Electronically Signed   By: David  [place]   On: 04/07/2013 09:13  EKG Interpretation     Ventricular Rate:  79 PR Interval:  156 QRS Duration: 88 QT Interval:  386 QTC Calculation: 442 R Axis:   -5 Text Interpretation:  Normal sinus rhythm Normal ECG No prior studies            MDM   1. Right upper quadrant pain   2. Cholecystitis, acute    15F here with pressure and fullness across entire upper abdomen. 2nd episode this week. Both episodes have occurred after eating beef stew. No prior history of this or any cardiac problems. Patient has no fever, cough, chest pain, vomiting. No exacerbating factors, relieved with bending over. AFVSS here. Patient has epigastric pain and marked RUQ pain. Patient has clear lungs, no other abdominal pain. I believe patient's symptoms are secondary to biliary colic vs gastritis. Will check labs, give GI cocktail, obtain RUQ [place]. EKG normal, will check troponin.  RUQ US shows cholecystitis. Mild leukocytosis. White count mildly elevated. Dr. Michaell Cowing with surgery consulted, will see patient in ED at [facility]. Dr[facility] Romeo Apple at [facility] accepting.   Dagmar Hait, MD 04/07/13 1004

## 2013-04-07 NOTE — Preoperative (Signed)
Beta Blockers   Reason not to administer Beta Blockers:Not Applicable 

## 2013-04-07 NOTE — ED Notes (Signed)
Report called to Marcelle Overlie RN  Charge at [facility] ED

## 2013-04-07 NOTE — Anesthesia Postprocedure Evaluation (Signed)
Anesthesia Post Note  Patient: Stephanie [name]  Procedure(s) Performed: Procedure(s) (LRB): LAPAROSCOPIC CHOLECYSTECTOMY (N/A) LIVER BIOPSY (N/A)  Anesthesia type: General  Patient location: PACU  Post pain: Pain level controlled  Post assessment: Post-op Vital signs reviewed  Last Vitals:  Filed Vitals:   04/07/13 1715  BP: 128/73  Pulse: 72  Temp: 36.7 C  Resp: 18    Post vital signs: Reviewed  Level of consciousness: sedated  Complications: No apparent anesthesia complications

## 2013-04-07 NOTE — ED Notes (Signed)
Bed: ZO10 Expected date:  Expected time:  Means of arrival:  Comments: Triage patient

## 2013-04-07 NOTE — ED Notes (Signed)
Patient changed into gown, RN at bedside.

## 2013-04-07 NOTE — ED Notes (Signed)
Patient transported to Ultrasound 

## 2013-04-07 NOTE — H&P (Signed)
Stephanie [name] is an 52 y.o. female.   Chief Complaint: Severe abdominal pain and nausea. Location/radiation/quality/duration/severity/prior symptoms/associated symptoms HPI: 52 y/o who had some beef stew last Sunday and developed mid abdominal pain, over night and it got better.  She reports about 1 week of abdominal bloating the week before this started.  She did well on Monday with no symptoms, and Tuesday evening she had beef stew again.  She went to bed and woke up about 4AM with 9/10 mid abdominal pain.  She tried gas-ex at home along with some other remedies, which did not work.  She got her son off to school and presented to the ER at [facility] with onging 9/10 pain, mid abdomen going to back since on set at 4AM.   She was having some nausea, nausea and sweating would come and go, pain persisted till she was treated in the ER.  She says she thought maybe she was having a heart attack. Work up in the ER shows an elevated WBC, her remaining labs were normal. Abdominal US:  1. There are multiple mobile gallstones. There is a positive sonographic Murphy's sign. No gallbladder wall thickening or pericholecystic fluid is demonstrated. The findings likely reflect acute cholecystitis accompanying cholelithiasis. 2. There is minimal dilation of the common bowel duct to 4 mm. 3 mm is degenerated upper limits jail accepted upper limits of normal 3. Minimal hydronephrosis bilaterally is suspected.  Pt was transferred to [facility] and here she is seen and evaluated for cholecystitis. Past Medical History  Diagnosis Date  . Polycystic ovarian syndrome   . Headache   . Hypertension   . Thyroid disease   . Obesity BMI 37.7   . Herpes genitalia     Past Surgical History  Procedure Laterality Date  . Breast reduction surgery  1994  . Myomectomy  11/2000  . Cesarean section  5/04    Family History  Problem Relation Age of Onset  . Cancer Mother     leukemia  . Hypertension Father   . Heart disease  Maternal Grandmother   . Heart disease Maternal Grandfather   . Aneurysm Maternal Grandfather   . Aneurysm Paternal Grandmother    Social History:  reports that she has never smoked. She has never used smokeless tobacco. She reports that she does not drink alcohol or use illicit drugs.  Allergies:  Allergies  Allergen Reactions  . Morphine And Related Other (See Comments)    headache   Prior to Admission medications   Medication Sig Start Date End Date Taking? Authorizing Provider  amLODipine (NORVASC) 10 MG tablet Take 10 mg by mouth daily.   Yes Historical Provider, MD  benazepril (LOTENSIN) 20 MG tablet Take 20 mg by mouth daily.   Yes Historical Provider, MD  Diethylpropion HCl 25 MG TABS Take 25 mg by mouth daily.    Yes Historical Provider, MD  levonorgestrel-ethinyl estradiol (TRIVORA, 28,) tablet Take 1 tablet by mouth daily. 03/05/12  Yes Kendrick Ranch, MD  levothyroxine (SYNTHROID, LEVOTHROID) 50 MCG tablet Take 50 mcg by mouth daily before breakfast.   Yes Historical Provider, MD  loratadine (CLARITIN) 10 MG tablet Take 10 mg by mouth daily as needed for allergies.    Yes Historical Provider, MD  phentermine 37.5 MG capsule Take 37.5 mg by mouth every morning. Wt loss  Yes Historical Provider, MD  spironolactone (ALDACTONE) 50 MG tablet Take 50 mg by mouth 2 (two) times daily. For fluid retention  Yes Historical Provider, MD     (  Not in a hospital admission)  Results for orders placed during the hospital encounter of 04/07/13 (from the past 48 hour(s))  CBC     Status: Abnormal   Collection Time    04/07/13  8:20 AM      Result Value Range   WBC 12.3 (*) 4.0 - 10.5 K/uL   RBC 4.70  3.87 - 5.11 MIL/uL   Hemoglobin 14.6  12.0 - 15.0 g/dL   HCT 78.2  95.6 - 21.3 %   MCV 91.1  78.0 - 100.0 fL   MCH 31.1  26.0 - 34.0 pg   MCHC 34.1  30.0 - 36.0 g/dL   RDW 08.6  57.8 - 46.9 %   Platelets 327  150 - 400 K/uL  BASIC METABOLIC PANEL     Status: None   Collection  Time    04/07/13  8:20 AM      Result Value Range   Sodium 135  135 - 145 mEq/L   Potassium 3.9  3.5 - 5.1 mEq/L   Chloride 98  96 - 112 mEq/L   CO2 25  19 - 32 mEq/L   Glucose, Bld 97  70 - 99 mg/dL   BUN 20  6 - 23 mg/dL   Creatinine, Ser 6.29  0.50 - 1.10 mg/dL   Calcium 9.8  8.4 - 52.8 mg/dL   GFR calc non Af Amer >90  >90 mL/min   GFR calc Af Amer >90  >90 mL/min   Comment: (NOTE)     The eGFR has been calculated using the CKD EPI equation.     This calculation has not been validated in all clinical situations.     eGFR's persistently <90 mL/min signify possible Chronic Kidney     Disease.  HEPATIC FUNCTION PANEL     Status: None   Collection Time    04/07/13  8:20 AM      Result Value Range   Total Protein 7.8  6.0 - 8.3 g/dL   Albumin 4.0  3.5 - 5.2 g/dL   AST 16  0 - 37 U/L   ALT 18  0 - 35 U/L   Alkaline Phosphatase 50  39 - 117 U/L   Total Bilirubin 0.3  0.3 - 1.2 mg/dL   Bilirubin, Direct <4.1  0.0 - 0.3 mg/dL   Indirect Bilirubin NOT CALCULATED  0.3 - 0.9 mg/dL  LIPASE, BLOOD     Status: None   Collection Time    04/07/13  8:20 AM      Result Value Range   Lipase 22  11 - 59 U/L  TROPONIN I     Status: None   Collection Time    04/07/13  8:20 AM      Result Value Range   Troponin I <0.30  <0.30 ng/mL   Comment:            Due to the release kinetics of cTnI,     a negative result within the first hours     of the onset of symptoms does not rule out     myocardial infarction with certainty.     If myocardial infarction is still suspected,     repeat the test at appropriate intervals.  URINALYSIS, ROUTINE W REFLEX MICROSCOPIC     Status: None   Collection Time    04/07/13  8:25 AM      Result Value Range   Color, Urine YELLOW  YELLOW   APPearance CLEAR  CLEAR  Specific Gravity, Urine 1.021  1.005 - 1.030   pH 6.0  5.0 - 8.0   Glucose, UA NEGATIVE  NEGATIVE mg/dL   Hgb urine dipstick NEGATIVE  NEGATIVE   Bilirubin Urine NEGATIVE  NEGATIVE   Ketones,  ur NEGATIVE  NEGATIVE mg/dL   Protein, ur NEGATIVE  NEGATIVE mg/dL   Urobilinogen, UA 0.2  0.0 - 1.0 mg/dL   Nitrite NEGATIVE  NEGATIVE   Leukocytes, UA NEGATIVE  NEGATIVE   Comment: MICROSCOPIC NOT DONE ON URINES WITH NEGATIVE PROTEIN, BLOOD, LEUKOCYTES, NITRITE, OR GLUCOSE <1000 mg/dL.  PREGNANCY, URINE     Status: None   Collection Time    04/07/13  8:25 AM      Result Value Range   Preg Test, Ur NEGATIVE  NEGATIVE   Comment:            THE SENSITIVITY OF THIS     METHODOLOGY IS >20 mIU/mL.   Dg Chest 2 View  04/07/2013   CLINICAL DATA:  epigastric region pain  EXAM: CHEST  2 VIEW  COMPARISON:  Oct 29, 2012  FINDINGS: There is no edema or consolidation. Heart size and pulmonary vascularity are normal. No adenopathy. No pneumothorax. There is slight degenerative change in the thoracic spine.  IMPRESSION: No edema or consolidation.   Electronically Signed   By: Bretta Bang M.D.   On: 04/07/2013 09:07   US Abdomen Complete  04/07/2013   CLINICAL DATA:  Abdominal pain and nausea with right upper quadrant tenderness. Marland Kitchen  EXAM: ULTRASOUND ABDOMEN COMPLETE  COMPARISON:  None  FINDINGS: Gallbladder  The gallbladder exhibits multiple echogenic mobile shadowing stones. There is a positive sonographic Murphy's sign. The gallbladder wall measures 3 mm in thickness. No pericholecystic fluid is demonstrated.  Common bile duct  Diameter: The common bile duct measures 4 mm in diameter. No intraluminal echoes are demonstrated.  Liver  The liver exhibits normal echotexture with no focal mass nor ductal dilation.  IVC  Evaluation of the IVC is limited by bowel gas.  Pancreas  Bowel gas limits evaluation of the pancreas.  Spleen  The spleen is normal in echotexture and measures 8 female cm in greatest dimension.  Right Kidney  Length: 12.9 cm. There is mild splitting of the central echo complex  Left Kidney  Length: 12 cm there is mild splitting of the central echo complex.  Abdominal aorta  The abdominal aorta  could be only partially imaged due to bowel gas. The maximal measured dimension is 2.7 cm.  IMPRESSION: 1. There are multiple mobile gallstones. There is a positive sonographic Murphy's sign. No gallbladder wall thickening or pericholecystic fluid is demonstrated. The findings likely reflect acute cholecystitis accompanying cholelithiasis. 2. There is minimal dilation of the common bowel duct to 4 mm. 3 mm is degenerated upper limits jail accepted upper limits of normal 3. Minimal hydronephrosis bilaterally is suspected.   Electronically Signed   By: David  [place]   On: 04/07/2013 09:13    Review of Systems  Constitutional: Negative for weight loss.       No fever or chills, but felt sweaty. Wt stable for about 1 year  Eyes: Negative.   Respiratory: Negative.   Cardiovascular: Negative.   Gastrointestinal: Positive for nausea and abdominal pain (Pain was mid abdomen and is most severe in the right upper quadrant.). Negative for heartburn, vomiting, diarrhea, constipation, blood in stool and melena.  Genitourinary: Negative.   Musculoskeletal: Negative.   Skin: Negative.   Neurological: Positive for headaches (  She has allot of headaches, never diagnosed.  seem to be stress related.Marland Kitchen).  Endo/Heme/Allergies: Negative.   Psychiatric/Behavioral: Negative.        Alert oriented to time, person and place.    Blood pressure 106/65, pulse 74, temperature 98.5 F (36.9 C), temperature source Oral, resp. rate 16, last menstrual period 03/24/2013, SpO2 99.00%. Physical Exam  Constitutional: She is oriented to person, place, and time. She appears well-developed and well-nourished. She appears distressed (having pain mostly in RUQ.).  HENT:  Head: Normocephalic and atraumatic.  Nose: Nose normal.  Eyes: Conjunctivae and EOM are normal. Pupils are equal, round, and reactive to light. Right eye exhibits no discharge. Left eye exhibits no discharge. No scleral icterus.  Neck: Normal range of motion. Neck  supple. No JVD present. No tracheal deviation present. No thyromegaly present.  Cardiovascular: Normal rate, regular rhythm, normal heart sounds and intact distal pulses.  Exam reveals no gallop.   No murmur heard. Respiratory: Effort normal and breath sounds normal. No respiratory distress. She has no wheezes. She has no rales. She exhibits no tenderness.  GI: Soft. Bowel sounds are normal. She exhibits distension (some distension for about 1 week). She exhibits no mass. There is tenderness (right side going to mid abdomen.  she has some pain on right with palpation of the LUQ). There is no rebound and no guarding.  Musculoskeletal: She exhibits no edema.  Lymphadenopathy:    She has no cervical adenopathy.  Neurological: She is alert and oriented to person, place, and time. No cranial nerve deficit.  Skin: Skin is warm and dry.  Psychiatric: She has a normal mood and affect. Her behavior is normal. Judgment and thought content normal.  Patient is alert and oriented to time, place, and person.  Anxious and working on care of her son tonight.     Assessment/Plan 1. Cholelithiasis and cholecystitis 2. Hypertension 3. Hypothyroid 4.  Polycystic ovarian syndrome 5.  Herpes genitalia 6.  BMI 37.7 7.  Headaches, with stress and Morphine.   Plan:  NPO, IV hydration, IV antibiotics, and surgery this afternoon. Dr. Michaell Cowing discussed risk and benefits with patient and friend with her.  Milda Lindvall 04/07/2013, 2:55 PM  SCHOENHOFF,DEBBIE, MD:  PCP

## 2013-04-07 NOTE — Transfer of Care (Signed)
Immediate Anesthesia Transfer of Care Note  Patient: Stephanie [name]  Procedure(s) Performed: Procedure(s): LAPAROSCOPIC CHOLECYSTECTOMY (N/A) LIVER BIOPSY (N/A)  Patient Location: PACU  Anesthesia Type:General  Level of Consciousness: awake, alert  and oriented  Airway & Oxygen Therapy: Patient Spontanous Breathing and Patient connected to face mask oxygen  Post-op Assessment: Report given to PACU RN and Post -op Vital signs reviewed and stable  Post vital signs: Reviewed and stable  Complications: No apparent anesthesia complications

## 2013-04-07 NOTE — ED Notes (Signed)
PT IS A SINGLE MOTHER  REQUESTING TIME TO MAKE ARRANGMENTS FOR CHILD. EDP [place] IN TO SEE PT AND EVALUATE. FAMILY PRESENT. OR CALLED AND UPDATED ON PT CURRENT STATUS.

## 2013-04-07 NOTE — Anesthesia Preprocedure Evaluation (Signed)
Anesthesia Evaluation  Patient identified by MRN, date of birth, ID band Patient awake    Reviewed: Allergy & Precautions, H&P , NPO status , Patient's Chart, lab work & pertinent test results  Airway Mallampati: II TM Distance: >3 FB Neck ROM: Full    Dental  (+) Teeth Intact and Dental Advisory Given   Pulmonary neg pulmonary ROS,  breath sounds clear to auscultation  Pulmonary exam normal       Cardiovascular hypertension, Pt. on medications and Pt. on home beta blockers Rhythm:Regular Rate:Normal     Neuro/Psych negative neurological ROS  negative psych ROS   GI/Hepatic negative GI ROS, Neg liver ROS,   Endo/Other  Hypothyroidism   Renal/GU negative Renal ROS  negative genitourinary   Musculoskeletal negative musculoskeletal ROS (+)   Abdominal   Peds negative pediatric ROS (+)  Hematology negative hematology ROS (+)   Anesthesia Other Findings   Reproductive/Obstetrics                           Anesthesia Physical Anesthesia Plan  ASA: II and emergent  Anesthesia Plan: General   Post-op Pain Management:    Induction: Intravenous  Airway Management Planned: Oral ETT  Additional Equipment:   Intra-op Plan:   Post-operative Plan: Extubation in OR  Informed Consent: I have reviewed the patients History and Physical, chart, labs and discussed the procedure including the risks, benefits and alternatives for the proposed anesthesia with the patient or authorized representative who has indicated his/her understanding and acceptance.   Dental advisory given  Plan Discussed with: CRNA  Anesthesia Plan Comments:         Anesthesia Quick Evaluation

## 2013-04-07 NOTE — ED Notes (Signed)
PT WOULD LIKE STACEY BLACKBURN NOTIFIED OF PT RECOVERY STATUS AFTER SURGERY [phone]

## 2013-04-08 ENCOUNTER — Encounter: Payer: Self-pay | Admitting: General Surgery

## 2013-04-08 ENCOUNTER — Telehealth (INDEPENDENT_AMBULATORY_CARE_PROVIDER_SITE_OTHER): Payer: Self-pay

## 2013-04-08 ENCOUNTER — Encounter (HOSPITAL_COMMUNITY): Payer: Self-pay | Admitting: Surgery

## 2013-04-08 DIAGNOSIS — K8000 Calculus of gallbladder with acute cholecystitis without obstruction: Secondary | ICD-10-CM | POA: Diagnosis present

## 2013-04-08 LAB — CBC
HCT: 38.1 % (ref 36.0–46.0)
Hemoglobin: 13 g/dL (ref 12.0–15.0)
MCH: 30.4 pg (ref 26.0–34.0)
MCHC: 34.1 g/dL (ref 30.0–36.0)
MCV: 89 fL (ref 78.0–100.0)
Platelets: 291 10*3/uL (ref 150–400)
RBC: 4.28 MIL/uL (ref 3.87–5.11)
RDW: 12.5 % (ref 11.5–15.5)
WBC: 10.5 10*3/uL (ref 4.0–10.5)

## 2013-04-08 LAB — BASIC METABOLIC PANEL
BUN: 9 mg/dL (ref 6–23)
CO2: 24 mEq/L (ref 19–32)
Calcium: 9 mg/dL (ref 8.4–10.5)
Chloride: 102 mEq/L (ref 96–112)
Creatinine, Ser: 0.49 mg/dL — ABNORMAL LOW (ref 0.50–1.10)
GFR calc Af Amer: >90 mL/min (ref >90)
GFR calc non Af Amer: >90 mL/min (ref >90)
Glucose, Bld: 150 mg/dL — ABNORMAL HIGH (ref 70–99)
Potassium: 4.3 mEq/L (ref 3.5–5.1)
Sodium: 136 mEq/L (ref 135–145)

## 2013-04-08 MED ORDER — ACETAMINOPHEN 325 MG PO TABS: 650.0000 mg | ORAL_TABLET | Freq: Four times a day (QID) | ORAL | Status: DC | PRN

## 2013-04-08 MED ORDER — NAPROXEN 500 MG PO TABS
500.0000 mg | ORAL_TABLET | Freq: Two times a day (BID) | ORAL | Status: DC
Start: 2013-04-08 — End: 2013-04-08
  Filled 2013-04-08 (×2): qty 1

## 2013-04-08 MED ORDER — METHOCARBAMOL 500 MG PO TABS
750.0000 mg | ORAL_TABLET | Freq: Four times a day (QID) | ORAL | Status: DC | PRN
  Administered 2013-04-08: 750 mg via ORAL
  Filled 2013-04-08 (×2): qty 2

## 2013-04-08 MED ORDER — METHOCARBAMOL 750 MG PO TABS: 750.0000 mg | ORAL_TABLET | Freq: Four times a day (QID) | ORAL | Status: DC | PRN

## 2013-04-08 NOTE — Discharge Summary (Signed)
Physician Discharge Summary  Patient ID: Stephanie [name] MRN: [id number] DOB/AGE: 51-17-1962 51 y.o.  Admit date: 04/07/2013 Discharge date: 04/08/2013  Admission Diagnoses:  1. Cholelithiasis and cholecystitis    Discharge Diagnoses:  1.Acute cholecystitis, Cholecystolithiasis, Fatty steatohepatitis. Possible early cirrhosis. 2. Hypertension  3. Hypothyroid  4. Polycystic ovarian syndrome  5. Herpes genitalia  6. BMI 37.7  7. Headaches, with stress and Morphine.  Principal Problem:   Cholelithiasis and acute cholecystitis without obstruction   PROCEDURES: LAPAROSCOPIC CHOLECYSTECTOMY, LIVER BIOPSY, 04/07/2013, Ardeth Sportsman, MD  Hospital Course: 52 y/o who had some beef stew last Sunday and developed mid abdominal pain, over night and it got better. She reports about 1 week of abdominal bloating the week before this started. She did well on Monday with no symptoms, and Tuesday evening she had beef stew again. She went to bed and woke up about 4AM with 9/10 mid abdominal pain. She tried gas-ex at home along with some other remedies, which did not work. She got her son off to school and presented to the ER at [facility] with onging 9/10 pain, mid abdomen going to back since on set at 4AM. She was having some nausea, nausea and sweating would come and go, pain persisted till she was treated in the ER. She says she thought maybe she was having a heart attack.  Work up in the ER shows an elevated WBC, her remaining labs were normal.  Abdominal US: 1. There are multiple mobile gallstones. There is a positive sonographic Murphy's sign. No gallbladder wall thickening or pericholecystic fluid is demonstrated. The findings likely reflect acute cholecystitis accompanying cholelithiasis. 2. There is minimal dilation of the common bowel duct to 4 mm. 3 mm is degenerated upper limits jail accepted upper limits of normal 3. Minimal hydronephrosis bilaterally is suspected. Pt was transferred to [facility] for  evaluation by Dr. Michaell Cowing.  He saw her in the ER here and recommended cholecystectomy.  Pt agreed and was taken to the OR.  She underwent the procedure described above.  She had a difficult night, but improved, we gave her some Robaxin for neck spasms and she wanted to go home after lunch.  Condition on D/C:  Improved    Disposition: 01-Home or Self Care      Discharge Orders   Future Appointments Provider Department Dept Phone   05/13/2013 1:15 PM Kendrick Ranch, MD [facility] [phone]   Future Orders Complete By Expires   Call MD for:  extreme fatigue  As directed    Call MD for:  hives  As directed    Call MD for:  persistant nausea and vomiting  As directed    Call MD for:  redness, tenderness, or signs of infection (pain, swelling, redness, odor or green/yellow discharge around incision site)  As directed    Call MD for:  severe uncontrolled pain  As directed    Call MD for:  As directed    Comments:     Temperature > 101.33F   Diet - low sodium heart healthy  As directed    Discharge instructions  As directed    Comments:     Please see discharge instruction sheets.  Also refer to handout given an office.  Please call our office if you have any questions or concerns [phone]   Discharge wound care:  As directed    Comments:     If you have closed incisions, shower and bathe over these incisions  with soap and water every day.  Remove all surgical dressings on postoperative day #3.  You do not need to replace dressings over the closed incisions unless you feel more comfortable with a Band-Aid covering it.   If you have an open wound that requires packing, please see wound care instructions.  In general, remove all dressings, wash wound with soap and water and then replace with saline moistened gauze.  Do the dressing change at least every day.  Please call our office [phone] if you have further questions.   Driving Restrictions  As directed     Comments:     No driving until off narcotics and can safely swerve away without pain during an emergency   Increase activity slowly  As directed    Comments:     Walk an hour a day.  Use 20-30 minute walks.  When you can walk 30 minutes without difficulty, increase to low impact/moderate activities such as biking, jogging, swimming, sexual activity..  Eventually can increase to unrestricted activity when not feeling pain.  If you feel pain: STOP!Marland Kitchen   Let pain protect you from overdoing it.  Use ice/heat/over-the-counter pain medications to help minimize his soreness.  Use pain prescriptions as needed to remain active.  It is better to take extra pain medications and be more active than to stay bedridden to avoid all pain medications.   Lifting restrictions  As directed    Comments:     Avoid heavy lifting initially.  Do not push through pain.  You have no specific weight limit.  Coughing and sneezing or four more stressful to your incision than any lifting you will do. Pain will protect you from injury.  Therefore, avoid intense activity until off all narcotic pain medications.  Coughing and sneezing or four more stressful to your incision than any lifting he will do.   May shower / Bathe  As directed    May walk up steps  As directed    Sexual Activity Restrictions  As directed    Comments:     Sexual activity as tolerated.  Do not push through pain.  Pain will protect you from injury.   Walk with assistance  As directed    Comments:     Walk over an hour a day.  May use a walker/cane/companion to help with balance and stamina.       Medication List         acetaminophen 325 MG tablet  Commonly known as:  TYLENOL  Take 2 tablets (650 mg total) by mouth every 6 (six) hours as needed for mild pain (or Temp > 100).     amLODipine 10 MG tablet  Commonly known as:  NORVASC  Take 10 mg by mouth daily.     benazepril 20 MG tablet  Commonly known as:  LOTENSIN  Take 20 mg by mouth daily.      Diethylpropion HCl 25 MG Tabs  Take 25 mg by mouth daily.     levonorgestrel-ethinyl estradiol tablet  Commonly known as:  TRIVORA (28)  Take 1 tablet by mouth daily.     levothyroxine 50 MCG tablet  Commonly known as:  SYNTHROID, LEVOTHROID  Take 50 mcg by mouth daily before breakfast.     loratadine 10 MG tablet  Commonly known as:  CLARITIN  Take 10 mg by mouth daily as needed for allergies.     methocarbamol 750 MG tablet  Commonly known as:  ROBAXIN  Take 1  tablet (750 mg total) by mouth every 6 (six) hours as needed for muscle spasms.     oxyCODONE 5 MG immediate release tablet  Commonly known as:  Oxy IR/ROXICODONE  Take 1-2 tablets (5-10 mg total) by mouth every 4 (four) hours as needed for moderate pain, severe pain or breakthrough pain.     phentermine 37.5 MG capsule  Take 37.5 mg by mouth every morning.     spironolactone 50 MG tablet  Commonly known as:  ALDACTONE  Take 50 mg by mouth 2 (two) times daily.       Follow-up Information   Follow up with CCS,MD, MD. Schedule an appointment as soon as possible for a visit in 3 weeks.   Specialty:  General Surgery   Contact information:   [address] [place] [address] [phone]       Follow up with Ardeth Sportsman., MD. Schedule an appointment as soon as possible for a visit in 2 weeks. (Make an appointment in 2-3 weeks.)    Specialty:  General Surgery   Contact information:   [address] [place] [address] [phone]       Signed: Sherrie George 04/08/2013, 4:21 PM

## 2013-04-08 NOTE — Telephone Encounter (Signed)
Called pt to notify her that Dr Michaell Cowing did respond to the message. I advised pt that she had some fatty areas on the liver that Dr Michaell Cowing wanted to bx to make sure not cancer or chirrosis. I advised pt that Dr Michaell Cowing stated he knew also the pt took a moderate amount of medication so he just wanted to make sure with a liver bx that there is nothing there worrisome. The pt understands and is fine now that she knows why the bx was done.

## 2013-04-08 NOTE — Progress Notes (Signed)
D/C patient from hospital when patient meets criteria (anticipate in 0-1 day(s)):  Tolerating oral intake well Ambulating in walkways Adequate pain control without IV medications Urinating  Having flatus  Patient had significant fatty change in her liver. Obtained Core liver biopsies in OR to see if has significant steatohepatitis or possible early cirrhosis.  We will call pathology results as soon as they come back  Increase activity as tolerated to regular activity.  Low impact exercise such as walking an hour a day at least ideal.  Do not push through pain.  Diet as tolerated.  Low fat high fiber diet ideal.  Bowel regimen with 30 g fiber a day and fiber supplement as needed to avoid problems.  Instructions discussed.  Followup with primary care physician for other health issues as would normally be done.  Questions answered.  The patient expressed understanding and appreciation

## 2013-04-08 NOTE — Progress Notes (Signed)
1 Day Post-Op  Subjective: Could not sleep very well last PM.  Had a single percocet and that didn't really help.  Up voiding allot last PM so she is tired and feels bad this AM.  She did tolerate a diet this AM.  Objective: Vital signs in last 24 hours: Temp:  [98 F (36.7 C)-98.5 F (36.9 C)] 98.2 F (36.8 C) (11/06 0521) Pulse Rate:  [65-93] 84 (11/06 0521) Resp:  [16-21] 18 (11/06 0521) BP: (106-132)/(65-82) 119/79 mmHg (11/06 0521) SpO2:  [93 %-100 %] 95 % (11/06 0521) Weight:  [104.327 kg (230 lb)] 104.327 kg (230 lb) (11/05 1900) Last BM Date: 04/06/13 DIET: CARDIAC AFEBRILE, VSS LABS ok Intake/Output from previous day: 11/05 0701 - 11/06 0700 In: 2693.8 [I.V.:2693.8] Out: 3050 [Urine:2850; Blood:200] Intake/Output this shift:    General appearance: alert, cooperative and no distress Resp: clear to auscultation bilaterally GI: soft, tender/sore, BS hypoactive, incision sites all dry.    Lab Results:   Recent Labs  04/07/13 0820 04/08/13 0350  WBC 12.3* 10.5  HGB 14.6 13.0  HCT 42.8 38.1  PLT 327 291    BMET  Recent Labs  04/07/13 0820 04/08/13 0350  NA 135 136  K 3.9 4.3  CL 98 102  CO2 25 24  GLUCOSE 97 150*  BUN 20 9  CREATININE 0.70 0.49*  CALCIUM 9.8 9.0   PT/INR No results found for this basename: LABPROT, INR,  in the last 72 hours   Recent Labs Lab 04/07/13 0820  AST 16  ALT 18  ALKPHOS 50  BILITOT 0.3  PROT 7.8  ALBUMIN 4.0     Lipase     Component Value Date/Time   LIPASE 22 04/07/2013 0820     Studies/Results: Dg Chest 2 View  04/07/2013   CLINICAL DATA:  epigastric region pain  EXAM: CHEST  2 VIEW  COMPARISON:  Oct 29, 2012  FINDINGS: There is no edema or consolidation. Heart size and pulmonary vascularity are normal. No adenopathy. No pneumothorax. There is slight degenerative change in the thoracic spine.  IMPRESSION: No edema or consolidation.   Electronically Signed   By: Bretta Bang M.D.   On: 04/07/2013 09:07    US Abdomen Complete  04/07/2013   CLINICAL DATA:  Abdominal pain and nausea with right upper quadrant tenderness. Marland Kitchen  EXAM: ULTRASOUND ABDOMEN COMPLETE  COMPARISON:  None  FINDINGS: Gallbladder  The gallbladder exhibits multiple echogenic mobile shadowing stones. There is a positive sonographic Murphy's sign. The gallbladder wall measures 3 mm in thickness. No pericholecystic fluid is demonstrated.  Common bile duct  Diameter: The common bile duct measures 4 mm in diameter. No intraluminal echoes are demonstrated.  Liver  The liver exhibits normal echotexture with no focal mass nor ductal dilation.  IVC  Evaluation of the IVC is limited by bowel gas.  Pancreas  Bowel gas limits evaluation of the pancreas.  Spleen  The spleen is normal in echotexture and measures 8 female cm in greatest dimension.  Right Kidney  Length: 12.9 cm. There is mild splitting of the central echo complex  Left Kidney  Length: 12 cm there is mild splitting of the central echo complex.  Abdominal aorta  The abdominal aorta could be only partially imaged due to bowel gas. The maximal measured dimension is 2.7 cm.  IMPRESSION: 1. There are multiple mobile gallstones. There is a positive sonographic Murphy's sign. No gallbladder wall thickening or pericholecystic fluid is demonstrated. The findings likely reflect acute cholecystitis accompanying  cholelithiasis. 2. There is minimal dilation of the common bowel duct to 4 mm. 3 mm is degenerated upper limits jail accepted upper limits of normal 3. Minimal hydronephrosis bilaterally is suspected.   Electronically Signed   By: David  [place]   On: 04/07/2013 09:13    Medications: . acetaminophen  1,000 mg Oral TID  . amLODipine  10 mg Oral Daily  . ampicillin-sulbactam (UNASYN) IV  3 g Intravenous Q6H  . benazepril  20 mg Oral Daily  . Diethylpropion HCl  25 mg Oral Daily  . heparin  5,000 Units Subcutaneous Q8H  . levonorgestrel-ethinyl estradiol  1 tablet Oral Daily  . levothyroxine  50  mcg Oral QAC breakfast  . lip balm  1 application Topical BID  . phentermine  37.5 mg Oral QAC breakfast  . psyllium  1 packet Oral BID  . saccharomyces boulardii  250 mg Oral BID  . spironolactone  50 mg Oral BID    Assessment/Plan 1.Acute cholecystitis, Cholecystolithiasis, Fatty steatohepatitis. Possible early cirrhosis.  S/P LAPAROSCOPIC CHOLECYSTECTOMY,  LIVER BIOPSY, Ardeth Sportsman, MD, 04/07/2013. 2. Hypertension  3. Hypothyroid  4. Polycystic ovarian syndrome  5. Herpes genitalia  6. BMI 37.7  7. Headaches, with stress and Morphine    Plan:  Add NSAID, decrease IV, mobilize.  When she can eat drink and mobilize on oral meds we will let her go home. I will check on her after lunch. She has IS and ambulate orders already.  Complaining of muscle cramps neck, and ask for muscle relaxant        LOS: 1 day    Jalan Fariss 04/08/2013

## 2013-04-08 NOTE — Telephone Encounter (Signed)
Message copied by Ethlyn Gallery on Thu Apr 08, 2013  3:35 PM ------      Message from: Ardeth Sportsman      Created: Thu Apr 08, 2013  3:10 PM      Regarding: RE: Dr Michaell Cowing      Contact: [phone]       Patient had some fatty change on the liver. I wanted to biopsy it to make sure there was nothing significant there. I do not see a cancer or tumor or anything scary. I doubt cirrhosis but wanted to double check since she takes a moderate amount of meds.            ----- Message -----         From: Ethlyn Gallery, MA         Sent: 04/08/2013   2:28 PM           To: Ardeth Sportsman, MD      Subject: FW: Dr Michaell Cowing                                                         ----- Message -----         From: Leanne Chang         Sent: 04/08/2013   2:15 PM           To: Ethlyn Gallery, MA      Subject: Dr Michaell Cowing                                                 Had emergency gb sx today and wants to know why Dr Michaell Cowing did liver bx. Has other questions about sx but could not speak to Dr Michaell Cowing before she left hospital. Says did not get clear answers from PA.       ------

## 2013-04-09 NOTE — Discharge Summary (Signed)
Recovering rather well.  Followup on pathology.  Suspect just fatty change in the liver.  Followup closely.

## 2013-04-12 ENCOUNTER — Other Ambulatory Visit: Payer: Self-pay

## 2013-04-12 DIAGNOSIS — Z1231 Encounter for screening mammogram for malignant neoplasm of breast: Secondary | ICD-10-CM

## 2013-04-16 ENCOUNTER — Telehealth (INDEPENDENT_AMBULATORY_CARE_PROVIDER_SITE_OTHER): Payer: Self-pay

## 2013-04-16 NOTE — Telephone Encounter (Signed)
Called pt to notify her of the pathology report shows chronic inflammation and the liver bx shows mild fatty change to the liver per Dr Michaell Cowing. The pt requested a copy to be sent to Dr Kinnie Scales and Dr Constance Goltz for her records.

## 2013-04-16 NOTE — Telephone Encounter (Signed)
Message copied by Ethlyn Gallery on Fri Apr 16, 2013  4:25 PM ------      Message from: Ardeth Sportsman      Created: Fri Apr 16, 2013  3:26 PM       Please call & tell the pt the results.  Pretty much as expected:            Results show chronic inflammation from the gallbladder - typical             Liver shows mild fatty change to the liver - common finding.  No cirrhosis.  No hepatitis.  Liver function normal.                   Diagnosis      1. Gallbladder      - CHRONIC CHOLECYSTITIS, CHOLELITHIASIS, AND CHOLESTEROLOSIS.      2. Liver, needle/core biopsy, r/o hepatosis and or cirrhosis      - STEATOSIS.      - NO EVIDENCE OF FIBROSIS. PLEASE SEE COMMENT.      Microscopic Comment      2. The biopsies are composed of benign liver parenchyma with slight steatosis (10%) with associated      minimum chronic inflammation. Trichrome and reticulin stains show no evidence of fibrosis. The overall      findings is relatively non specific. Clinical correlation is highly recommended. Dr. Luisa Hart has reviewed this      case and agrees with the above interpretation. (HCL:gt, 04/13/13)      Abigail Miyamoto MD      Pathologist, Electronic Signature      (Case signed 04/14/2013)      ----- Message -----         From: Ethlyn Gallery, MA         Sent: 04/16/2013   2:40 PM           To: Ardeth Sportsman, MD                        ----- Message -----         From: Zacarias Pontes         Sent: 04/16/2013  10:44 AM           To: Jehad Bisono, MA            Pt will like a call back with her liver bx results....[phone]       ------

## 2013-04-22 ENCOUNTER — Telehealth (INDEPENDENT_AMBULATORY_CARE_PROVIDER_SITE_OTHER): Payer: Self-pay

## 2013-04-22 NOTE — Telephone Encounter (Signed)
Faxed copy of path report done 11/6 to Dr Kinnie Scales [phone]#[phone] and Dr Constance Goltz [phone] per pt's request.

## 2013-04-26 ENCOUNTER — Encounter (INDEPENDENT_AMBULATORY_CARE_PROVIDER_SITE_OTHER): Payer: Self-pay | Admitting: Surgery

## 2013-04-26 ENCOUNTER — Ambulatory Visit (INDEPENDENT_AMBULATORY_CARE_PROVIDER_SITE_OTHER): Payer: 59 | Admitting: Surgery

## 2013-04-26 VITALS — BP 118/72 | HR 84 | Temp 98.4°F | Resp 15 | Ht 65.0 in | Wt 236.8 lb

## 2013-04-26 DIAGNOSIS — K8000 Calculus of gallbladder with acute cholecystitis without obstruction: Secondary | ICD-10-CM

## 2013-04-26 DIAGNOSIS — K7581 Nonalcoholic steatohepatitis (NASH): Secondary | ICD-10-CM | POA: Insufficient documentation

## 2013-04-26 DIAGNOSIS — K7689 Other specified diseases of liver: Secondary | ICD-10-CM

## 2013-04-26 DIAGNOSIS — E669 Obesity, unspecified: Secondary | ICD-10-CM

## 2013-04-26 NOTE — Patient Instructions (Addendum)
LAPAROSCOPIC SURGERY: POST OP INSTRUCTIONS  1. DIET: Follow a light bland diet the first 24 hours after arrival home, such as soup, liquids, crackers, etc.  Be sure to include lots of fluids daily.  Avoid fast food or heavy meals as your are more likely to get nauseated.  Eat a low fat the next few days after surgery.   2. Take your usually prescribed home medications unless otherwise directed. 3. PAIN CONTROL: a. Pain is best controlled by a usual combination of three different methods TOGETHER: i. Ice/Heat ii. Over the counter pain medication iii. Prescription pain medication b. Most patients will experience some swelling and bruising around the incisions.  Ice packs or heating pads (30-60 minutes up to 6 times a day) will help. Use ice for the first few days to help decrease swelling and bruising, then switch to heat to help relax tight/sore spots and speed recovery.  Some people prefer to use ice alone, heat alone, alternating between ice & heat.  Experiment to what works for you.  Swelling and bruising can take several weeks to resolve.   c. It is helpful to take an over-the-counter pain medication regularly for the first few weeks.  Choose one of the following that works best for you: i. Naproxen (Aleve, etc)  Two 236m tabs twice a day ii. Ibuprofen (Advil, etc) Three 2060mtabs four times a day (every meal & bedtime) iii. Acetaminophen (Tylenol, etc) 500-65025mour times a day (every meal & bedtime) d. A  prescription for pain medication (such as oxycodone, hydrocodone, etc) should be given to you upon discharge.  Take your pain medication as prescribed.  i. If you are having problems/concerns with the prescription medicine (does not control pain, nausea, vomiting, rash, itching, etc), please call us [phone] see if we need to switch you to a different pain medicine that will work better for you and/or control your side effect better. ii. If you need a refill on your pain medication,  please contact your pharmacy.  They will contact our office to request authorization. Prescriptions will not be filled after 5 pm or on week-ends. 4. Avoid getting constipated.  Between the surgery and the pain medications, it is common to experience some constipation.  Increasing fluid intake and taking a fiber supplement (such as Metamucil, Citrucel, FiberCon, MiraLax, etc) 1-2 times a day regularly will usually help prevent this problem from occurring.  A mild laxative (prune juice, Milk of Magnesia, MiraLax, etc) should be taken according to package directions if there are no bowel movements after 48 hours.   5. Watch out for diarrhea.  If you have many loose bowel movements, simplify your diet to bland foods & liquids for a few days.  Stop any stool softeners and decrease your fiber supplement.  Switching to mild anti-diarrheal medications (Kayopectate, Pepto Bismol) can help.  If this worsens or does not improve, please call us.[place]. Wash / shower every day.  You may shower over the dressings as they are waterproof.  Continue to shower over incision(s) after the dressing is off. 7. Remove your waterproof bandages 5 days after surgery.  You may leave the incision open to air.  You may replace a dressing/Band-Aid to cover the incision for comfort if you wish.  8. ACTIVITIES as tolerated:   a. You may resume regular (light) daily activities beginning the next day-such as daily self-care, walking, climbing stairs-gradually increasing activities as tolerated.  If you can walk 30 minutes without difficulty, it  is safe to try more intense activity such as jogging, treadmill, bicycling, low-impact aerobics, swimming, etc. b. Save the most intensive and strenuous activity for last such as sit-ups, heavy lifting, contact sports, etc  Refrain from any heavy lifting or straining until you are off narcotics for pain control.   c. DO NOT PUSH THROUGH PAIN.  Let pain be your guide: If it hurts to do something, don't do  it.  Pain is your body warning you to avoid that activity for another week until the pain goes down. d. You may drive when you are no longer taking prescription pain medication, you can comfortably wear a seatbelt, and you can safely maneuver your car and apply brakes. e. Dennis Bast may have sexual intercourse when it is comfortable.  9. FOLLOW UP in our office a. Please call [facility] at [phone] to set up an appointment to see your surgeon in the office for a follow-up appointment approximately 2-3 weeks after your surgery. b. Make sure that you call for this appointment the day you arrive home to insure a convenient appointment time. 10. IF YOU HAVE DISABILITY OR FAMILY LEAVE FORMS, BRING THEM TO THE OFFICE FOR PROCESSING.  DO NOT GIVE THEM TO YOUR DOCTOR.   WHEN TO CALL us [phone]: 1. Poor pain control 2. Reactions / problems with new medications (rash/itching, nausea, etc)  3. Fever over 101.5 F (38.5 C) 4. Inability to urinate 5. Nausea and/or vomiting 6. Worsening swelling or bruising 7. Continued bleeding from incision. 8. Increased pain, redness, or drainage from the incision   The clinic staff is available to answer your questions during regular business hours (8:30am-5pm).  Please don't hesitate to call and ask to speak to one of our nurses for clinical concerns.   If you have a medical emergency, go to the nearest emergency room or call 911.  A surgeon from [facility] is always on call at the [facility], [place], [place], [place], [place]  [address] ? MAIN: [phone] ? TOLL FREE: [phone] ?  FAX [phone] [email or web address]  Cholecystitis Cholecystitis is an inflammation of your gallbladder. It is usually caused by a buildup of gallstones or sludge (cholelithiasis) in your gallbladder. The gallbladder stores a fluid that helps digest fats (bile). Cholecystitis is serious and needs treatment  right away.  CAUSES   Gallstones. Gallstones can block the tube that leads to your gallbladder, causing bile to build up. As bile builds up, the gallbladder becomes inflamed.  Bile duct problems, such as blockage from scarring or kinking.  Tumors. Tumors can stop bile from leaving your gallbladder correctly, causing bile to build up. As bile builds up, the gallbladder becomes inflamed. SYMPTOMS   Nausea.  Vomiting.  Abdominal pain, especially in the upper right area of your abdomen.  Abdominal tenderness or bloating.  Sweating.  Chills.  Fever.  Yellowing of the skin and the whites of the eyes (jaundice). DIAGNOSIS  Your caregiver may order blood tests to look for infection or gallbladder problems. Your caregiver may also order imaging tests, such as an ultrasound or computed tomography (CT) scan. Further tests may include a hepatobiliary iminodiacetic acid (HIDA) scan. This scan allows your caregiver to see your bile move from the liver to the gallbladder and to the small intestine. TREATMENT  A hospital stay is usually necessary to lessen the inflammation of your gallbladder. You may be required to not eat or drink (fast) for a  certain amount of time. You may be given medicine to treat pain or an antibiotic medicine to treat an infection. Surgery may be needed to remove your gallbladder (cholecystectomy) once the inflammation has gone down. Surgery may be needed right away if you develop complications such as death of gallbladder tissue (gangrene) or a tear (perforation) of the gallbladder.  [place] care will depend on your treatment. In general:  If you were given antibiotics, take them as directed. Finish them even if you start to feel better.  Only take over-the-counter or prescription medicines for pain, discomfort, or fever as directed by your caregiver.  Follow a low-fat diet until you see your caregiver again.  Keep all follow-up visits as directed  by your caregiver. SEEK IMMEDIATE MEDICAL CARE IF:   Your pain is increasing and not controlled by medicines.  Your pain moves to another part of your abdomen or to your back.  You have a fever.  You have nausea and vomiting. MAKE SURE YOU:  Understand these instructions.  Will watch your condition.  Will get help right away if you are not doing well or get worse. Document Released: 05/20/2005 Document Revised: 08/12/2011 Document Reviewed: 04/05/2011 [facility] Patient Information 2014 [place], [place].  GETTING TO GOOD BOWEL HEALTH. Irregular bowel habits such as constipation and diarrhea can lead to many problems over time.  Having one soft bowel movement a day is the most important way to prevent further problems.  The anorectal canal is designed to handle stretching and feces to safely manage our ability to get rid of solid waste (feces, poop, stool) out of our body.  BUT, hard constipated stools can act like ripping concrete bricks and diarrhea can be a burning fire to this very sensitive area of our body, causing inflamed hemorrhoids, anal fissures, increasing risk is perirectal abscesses, abdominal pain/bloating, an making irritable bowel worse.     The goal: ONE SOFT BOWEL MOVEMENT A DAY!  To have soft, regular bowel movements:    Drink at least 8 tall glasses of water a day.     Take plenty of fiber.  Fiber is the undigested part of plant food that passes into the colon, acting s "natures broom" to encourage bowel motility and movement.  Fiber can absorb and hold large amounts of water. This results in a larger, bulkier stool, which is soft and easier to pass. Work gradually over several weeks up to 6 servings a day of fiber (25g a day even more if needed) in the form of: o Vegetables -- Root (potatoes, carrots, turnips), leafy green (lettuce, salad greens, celery, spinach), or cooked high residue (cabbage, broccoli, etc) o Fruit -- Fresh (unpeeled skin & pulp), Dried (prunes,  apricots, cherries, etc ),  or stewed ( applesauce)  o Whole grain breads, pasta, etc (whole wheat)  o Bran cereals    Bulking Agents -- This type of water-retaining fiber generally is easily obtained each day by one of the following:  o Psyllium bran -- The psyllium plant is remarkable because its ground seeds can retain so much water. This product is available as Metamucil, Konsyl, Effersyllium, Per Diem Fiber, or the less expensive generic preparation in drug and health food stores. Although labeled a laxative, it really is not a laxative.  o Methylcellulose -- This is another fiber derived from wood which also retains water. It is available as Citrucel. o Polyethylene Glycol - and "artificial" fiber commonly called Miralax or Glycolax.  It is helpful  for people with gassy or bloated feelings with regular fiber o Flax Seed - a less gassy fiber than psyllium   No reading or other relaxing activity while on the toilet. If bowel movements take longer than 5 minutes, you are too constipated   AVOID CONSTIPATION.  High fiber and water intake usually takes care of this.  Sometimes a laxative is needed to stimulate more frequent bowel movements, but    Laxatives are not a good long-term solution as it can wear the colon out. o Osmotics (Milk of Magnesia, Fleets phosphosoda, Magnesium citrate, MiraLax, GoLytely) are safer than  o Stimulants (Senokot, Castor Oil, Dulcolax, Ex Lax)    o Do not take laxatives for more than 7days in a row.    IF SEVERELY CONSTIPATED, try a Bowel Retraining Program: o Do not use laxatives.  o Eat a diet high in roughage, such as bran cereals and leafy vegetables.  o Drink six (6) ounces of prune or apricot juice each morning.  o Eat two (2) large servings of stewed fruit each day.  o Take one (1) heaping tablespoon of a psyllium-based bulking agent twice a day. Use sugar-free sweetener when possible to avoid excessive calories.  o Eat a normal breakfast.  o Set aside 15  minutes after breakfast to sit on the toilet, but do not strain to have a bowel movement.  o If you do not have a bowel movement by the third day, use an enema and repeat the above steps.    Controlling diarrhea o Switch to liquids and simpler foods for a few days to avoid stressing your intestines further. o Avoid dairy products (especially milk & ice cream) for a short time.  The intestines often can lose the ability to digest lactose when stressed. o Avoid foods that cause gassiness or bloating.  Typical foods include beans and other legumes, cabbage, broccoli, and dairy foods.  Every person has some sensitivity to other foods, so listen to our body and avoid those foods that trigger problems for you. o Adding fiber (Citrucel, Metamucil, psyllium, Miralax) gradually can help thicken stools by absorbing excess fluid and retrain the intestines to act more normally.  Slowly increase the dose over a few weeks.  Too much fiber too soon can backfire and cause cramping & bloating. o Probiotics (such as active yogurt, Align, etc) may help repopulate the intestines and colon with normal bacteria and calm down a sensitive digestive tract.  Most studies show it to be of mild help, though, and such products can be costly. o Medicines:   Bismuth subsalicylate (ex. Kayopectate, Pepto Bismol) every 30 minutes for up to 6 doses can help control diarrhea.  Avoid if pregnant.   Loperamide (Immodium) can slow down diarrhea.  Start with two tablets (4mg  total) first and then try one tablet every 6 hours.  Avoid if you are having fevers or severe pain.  If you are not better or start feeling worse, stop all medicines and call your doctor for advice o Call your doctor if you are getting worse or not better.  Sometimes further testing (cultures, endoscopy, X-ray studies, bloodwork, etc) may be needed to help diagnose and treat the cause of the diarrhea.  Exercise to Lose Weight Exercise and a healthy diet may help you  lose weight. Your doctor may suggest specific exercises. EXERCISE IDEAS AND TIPS  Choose low-cost things you enjoy doing, such as walking, bicycling, or exercising to workout videos.  Take stairs instead of the  elevator.  Walk during your lunch break.  Park your car further away from work or school.  Go to a gym or an exercise class.  Start with 5 to 10 minutes of exercise each day. Build up to 30 minutes of exercise 4 to 6 days a week.  Wear shoes with good support and comfortable clothes.  Stretch before and after working out.  Work out until you breathe harder and your heart beats faster.  Drink extra water when you exercise.  Do not do so much that you hurt yourself, feel dizzy, or get very short of breath. Exercises that burn about 150 calories:  Running 1  miles in 15 minutes.  Playing volleyball for 45 to 60 minutes.  Washing and waxing a car for 45 to 60 minutes.  Playing touch football for 45 minutes.  Walking 1  miles in 35 minutes.  Pushing a stroller 1  miles in 30 minutes.  Playing basketball for 30 minutes.  Raking leaves for 30 minutes.  Bicycling 5 miles in 30 minutes.  Walking 2 miles in 30 minutes.  Dancing for 30 minutes.  Shoveling snow for 15 minutes.  Swimming laps for 20 minutes.  Walking up stairs for 15 minutes.  Bicycling 4 miles in 15 minutes.  Gardening for 30 to 45 minutes.  Jumping rope for 15 minutes.  Washing windows or floors for 45 to 60 minutes. Document Released: 06/22/2010 Document Revised: 08/12/2011 Document Reviewed: 06/22/2010 [facility] Patient Information 2014 [place], [place].  Fatty Liver Fatty liver is the accumulation of fat in liver cells. It is also called hepatosteatosis or steatohepatitis. It is normal for your liver to contain some fat. If fat is more than 5 to 10% of your liver's weight, you have fatty liver.  There are often no symptoms (problems) for years while damage is still occurring. People  often learn about their fatty liver when they have medical tests for other reasons. Fat can damage your liver for years or even decades without causing problems. When it becomes severe, it can cause fatigue, weight loss, weakness, and confusion. This makes you more likely to develop more serious liver problems. The liver is the largest organ in the body. It does a lot of work and often gives no warning signs when it is sick until late in a disease. The liver has many important jobs including:  Breaking down foods.  Storing vitamins, iron, and other minerals.  Making proteins.  Making bile for food digestion.  Breaking down many products including medications, alcohol and some poisons. CAUSES  There are a number of different conditions, medications, and poisons that can cause a fatty liver. Eating too many calories causes fat to build up in the liver. Not processing and breaking fats down normally may also cause this. Certain conditions, such as obesity, diabetes, and high triglycerides also cause this. Most fatty liver patients tend to be middle-aged and over weight.  Some causes of fatty liver are:  Alcohol over consumption.  Malnutrition.  Steroid use.  Valproic acid toxicity.  Obesity.  Cushing's syndrome.  Poisons.  Tetracycline in high dosages.  Pregnancy.  Diabetes.  Hyperlipidemia.  Rapid weight loss. Some people develop fatty liver even having none of these conditions. SYMPTOMS  Fatty liver most often causes no problems. This is called asymptomatic.  It can be diagnosed with blood tests and also by a liver biopsy.  It is one of the most common causes of minor elevations of liver enzymes on routine blood tests.  Specialized Imaging of the liver using ultrasound, CT (computed tomography) scan, or MRI (magnetic resonance imaging) can suggest a fatty liver but a biopsy is needed to confirm it.  A biopsy involves taking a small sample of liver tissue. This is done  by using a needle. It is then looked at under a microscope by a specialist. TREATMENT  It is important to treat the cause. Simple fatty liver without a medical reason may not need treatment.  Weight loss, fat restriction, and exercise in overweight patients produces inconsistent results but is worth trying.  Fatty liver due to alcohol toxicity may not improve even with stopping drinking.  Good control of diabetes may reduce fatty liver.  Lower your triglycerides through diet, medication or both.  Eat a balanced, healthy diet.  Increase your physical activity.  Get regular checkups from a liver specialist.  There are no medical or surgical treatments for a fatty liver or NASH, but improving your diet and increasing your exercise may help prevent or reverse some of the damage. PROGNOSIS  Fatty liver may cause no damage or it can lead to an inflammation of the liver. This is, called steatohepatitis. When it is linked to alcohol abuse, it is called alcoholic steatohepatitis. It often is not linked to alcohol. It is then called nonalcoholic steatohepatitis, or NASH. Over time the liver may become scarred and hardened. This condition is called cirrhosis. Cirrhosis is serious and may lead to liver failure or cancer. NASH is one of the leading causes of cirrhosis. About 10-20% of Americans have fatty liver and a smaller 2-5% has NASH. Document Released: 07/05/2005 Document Revised: 08/12/2011 Document Reviewed: 08/28/2005 [facility] Patient Information 2014 [place], [place].

## 2013-04-26 NOTE — Progress Notes (Signed)
Subjective:     Patient ID: Stephanie [name], female   DOB: [birth year], 52 y.o.   MRN: [id number]  HPI  Stephanie [name]  1960-11-12 [id number]  Patient Care Team: Kendrick Ranch, MD as PCP - General (Internal Medicine)  Procedure (Date: 04/07/2013):  POST-OPERATIVE DIAGNOSIS:  Acute cholecystitis  Cholecystolithiasis  Fatty steatohepatitis. Possible early cirrhosis.   PROCEDURE: Procedure(s):  LAPAROSCOPIC CHOLECYSTECTOMY  LIVER BIOPSY   SURGEON:  Ardeth Sportsman, MD  ASSISTANT: Bhavinkumar "Vin" Bhagat, PA student, [employer]   Diagnosis 1. Gallbladder - CHRONIC CHOLECYSTITIS, CHOLELITHIASIS, AND CHOLESTEROLOSIS. 2. Liver, needle/core biopsy, r/o hepatosis and or cirrhosis - STEATOSIS. - NO EVIDENCE OF FIBROSIS. PLEASE SEE COMMENT. Microscopic Comment 2. The biopsies are composed of benign liver parenchyma with slight steatosis (10%) with associated minimum chronic inflammation. Trichrome and reticulin stains show no evidence of fibrosis. The overall findings is relatively non specific. Clinical correlation is highly recommended. Dr. Luisa Hart has reviewed this case and agrees with the above interpretation. (HCL:gt, 04/13/13) Stephanie Miyamoto MD Pathologist, Electronic Signature (Case signed 04/14/2013) Specimen Stephanie [name] and Clinical Information Specimen(s) Obtained: 1. Gallbladder 2. Liver, needle/core biopsy, r/o hepatosis and or cirrhosis Specimen Clinical Information 1. Cholelithiasis; same as pre-op Stephanie [name]) Stephanie [name] 1. The specimen is received in formalin. Size/?Intact: [name] disrupted gallbladder measuring 9.1 x 3.4 x 2.2 cm. Serosal surface: Pink-purple, smooth, hyperemic, with [name] 0.2 cm defect at the fundus of the gallbladder. Mucosa/Wall: Mucosa, tan-pink. Wall, up to 0.4 cm in thickness. Contents: The gallbladder contains [name] moderate amount of brown-red bile and numerous tan-yellow, multifaceted, crumbling calculi ranging from 0.2 to 0.4 cm in greatest  dimension. Cystic duct: 0.3 cm in diameter, with multiple calculi lodged within the neck of the gallbladder. Block Summary: One block submitted. 1 of 2 FINAL for Stephanie [name], Stephanie [name] [phone]) Stephanie [name](continued) 2. The specimen is received in formalin and consists of six pieces of tan-brown soft tissue, ranging from 0.1 cm to 1.8 cm in length x 0.1 cm in diameter. The specimen is entirely submitted in one cassette. (KL:ecj 04/08/2013) Stain(s) used in Diagnosis: The following stain(s) were used in diagnosing the case: PAS/H w/Dig, Masson's Trichrome Stain, Reticulin Stain, Iron Stain. The control(s) stained appropriately.  This patient returns for surgical re-evaluation.  She is feeling much better.  Soreness is gone.  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  Regular bowel movements.  Energy level coming back.  Strength not totally there but almost there.  In good spirits overall.  Patient Active Problem List   Diagnosis Date Noted  . Cholelithiasis and acute cholecystitis s/p lap chole 04/07/2013 04/08/2013  . Bronchospasm 12/09/2012  . Dermatitis 12/09/2012  . Acne 03/06/2012  . Single cyst of left breast 04/23/2011  . Allergy   . Polycystic ovarian syndrome   . Hypertension   . Thyroid disease   . Herpes genitalia   . Obesity     Past Medical History  Diagnosis Date  . Polycystic ovarian syndrome   . Allergy   . Hypertension   . Thyroid disease   . Obesity   . Herpes genitalia   . Cholelithiasis and acute cholecystitis without obstruction 04/08/2013    Past Surgical History  Procedure Laterality Date  . Breast reduction surgery  1994  . Myomectomy  11/2000  . Cesarean section  5/04  . Cholecystectomy N/[name] 04/07/2013    Procedure: LAPAROSCOPIC CHOLECYSTECTOMY;  Surgeon: Ardeth Sportsman, MD;  Location: [facility];  Service: General;  Laterality: N/[name];  .  Liver biopsy N/[name] 04/07/2013    Procedure: LIVER BIOPSY;  Surgeon: Ardeth Sportsman, MD;  Location: [facility];  Service:  General;  Laterality: N/[name];    History   Social History  . Marital Status: Single    Spouse Name: N/[name]    Number of Children: N/[name]  . Years of Education: N/[name]   Occupational History  . Not on file.   Social History Main Topics  . Smoking status: Never Smoker   . Smokeless tobacco: Never Used  . Alcohol Use: Yes     Comment: rare occasions  . Drug Use: No  . Sexual Activity: Yes    Birth Control/ Protection: Pill   Other Topics Concern  . Not on file   Social History Narrative  . No narrative on file    Family History  Problem Relation Age of Onset  . Cancer Mother     leukemia  . Hypertension Father   . Heart disease Maternal Grandmother   . Heart disease Maternal Grandfather   . Aneurysm Maternal Grandfather   . Aneurysm Paternal Grandmother     Current Outpatient Prescriptions  Medication Sig Dispense Refill  . amLODipine (NORVASC) 10 MG tablet Take 10 mg by mouth daily.      . benazepril (LOTENSIN) 20 MG tablet Take 20 mg by mouth daily.      . Diethylpropion HCl 25 MG TABS Take 25 mg by mouth daily.       Marland Kitchen levonorgestrel-ethinyl estradiol (TRIVORA, 28,) tablet Take 1 tablet by mouth daily.  1 Package  11  . levothyroxine (SYNTHROID, LEVOTHROID) 50 MCG tablet Take 50 mcg by mouth daily before breakfast.      . phentermine 37.5 MG capsule Take 37.5 mg by mouth every morning.      Marland Kitchen spironolactone (ALDACTONE) 50 MG tablet Take 50 mg by mouth 2 (two) times daily.       No current facility-administered medications for this visit.     Allergies  Allergen Reactions  . Morphine And Related Other (See Comments)    headache    BP 118/72  Pulse 84  Temp(Src) 98.4 F (36.9 C) (Temporal)  Resp 15  Ht 5\' 5"  (1.651 m)  Wt 236 lb 12.8 oz (107.412 kg)  BMI 39.41 kg/m2  LMP 03/24/2013  Dg Chest 2 View  04/07/2013   CLINICAL DATA:  epigastric region pain  EXAM: CHEST  2 VIEW  COMPARISON:  Oct 29, 2012  FINDINGS: There is no edema or consolidation. Heart size and  pulmonary vascularity are normal. No adenopathy. No pneumothorax. There is slight degenerative change in the thoracic spine.  IMPRESSION: No edema or consolidation.   Electronically Signed   By: Bretta Bang M.D.   On: 04/07/2013 09:07   US Abdomen Complete  04/07/2013   CLINICAL DATA:  Abdominal pain and nausea with right upper quadrant tenderness. Marland Kitchen  EXAM: ULTRASOUND ABDOMEN COMPLETE  COMPARISON:  None  FINDINGS: Gallbladder  The gallbladder exhibits multiple echogenic mobile shadowing stones. There is [name] positive sonographic Murphy's sign. The gallbladder wall measures 3 mm in thickness. No pericholecystic fluid is demonstrated.  Common bile duct  Diameter: The common bile duct measures 4 mm in diameter. No intraluminal echoes are demonstrated.  Liver  The liver exhibits normal echotexture with no focal mass nor ductal dilation.  IVC  Evaluation of the IVC is limited by bowel gas.  Pancreas  Bowel gas limits evaluation of the pancreas.  Spleen  The spleen is normal in  echotexture and measures 8 female cm in greatest dimension.  Right Kidney  Length: 12.9 cm. There is mild splitting of the central echo complex  Left Kidney  Length: 12 cm there is mild splitting of the central echo complex.  Abdominal aorta  The abdominal aorta could be only partially imaged due to bowel gas. The maximal measured dimension is 2.7 cm.  IMPRESSION: 1. There are multiple mobile gallstones. There is [name] positive sonographic Murphy's sign. No gallbladder wall thickening or pericholecystic fluid is demonstrated. The findings likely reflect acute cholecystitis accompanying cholelithiasis. 2. There is minimal dilation of the common bowel duct to 4 mm. 3 mm is degenerated upper limits jail accepted upper limits of normal 3. Minimal hydronephrosis bilaterally is suspected.   Electronically Signed   By: David  [place]   On: 04/07/2013 09:13     Review of Systems  Constitutional: Negative for fever, chills and diaphoresis.  HENT:  Negative for ear pain, sore throat and trouble swallowing.   Eyes: Negative for photophobia and visual disturbance.  Respiratory: Negative for cough and choking.   Cardiovascular: Negative for chest pain and palpitations.  Gastrointestinal: Negative for nausea, vomiting, abdominal pain, diarrhea, constipation, anal bleeding and rectal pain.  Genitourinary: Negative for dysuria, frequency and difficulty urinating.  Musculoskeletal: Negative for gait problem and myalgias.  Skin: Negative for color change, pallor and rash.  Neurological: Negative for dizziness, speech difficulty, weakness and numbness.  Hematological: Negative for adenopathy.  Psychiatric/Behavioral: Negative for confusion and agitation. The patient is not nervous/anxious.        Objective:   Physical Exam  Constitutional: She is oriented to person, place, and time. She appears well-developed and well-nourished. No distress.  HENT:  Head: Normocephalic.  Mouth/Throat: Oropharynx is clear and moist. No oropharyngeal exudate.  Eyes: Conjunctivae and EOM are normal. Pupils are equal, round, and reactive to light. No scleral icterus.  Neck: Normal range of motion. No tracheal deviation present.  Cardiovascular: Normal rate and intact distal pulses.   Pulmonary/Chest: Effort normal. No respiratory distress. She exhibits no tenderness.  Abdominal: Soft. She exhibits no distension. There is no tenderness. Hernia confirmed negative in the right inguinal area and confirmed negative in the left inguinal area.  Incisions clean with normal healing ridges.  No hernias  Genitourinary: No vaginal discharge found.  Musculoskeletal: Normal range of motion. She exhibits no tenderness.  Lymphadenopathy:       Right: No inguinal adenopathy present.       Left: No inguinal adenopathy present.  Neurological: She is alert and oriented to person, place, and time. No cranial nerve deficit. She exhibits normal muscle tone. Coordination normal.   Skin: Skin is warm and dry. No rash noted. She is not diaphoretic.  Psychiatric: She has [name] normal mood and affect. Her behavior is normal.       Assessment:     Recovering well only [name] few weeks status post urgent cholecystectomy and core liver biopsy for chronic and acute cholecystitis with mild steatohepatitis     Plan:     I am glad she is recovered well so far.  I suspect she will continue to do that.  Again went over the operative findings.  I gave her [name] copy of the pathology report.  She told me her gastroenterologist, Dr. Kinnie Scales, was aware of the steatohepatitis and will keep an eye on that.  Increase activity as tolerated to regular activity.  Low impact exercise such as walking an hour [name] day at least  ideal.  Do not push through pain.  Diet as tolerated.  Low fat high fiber diet ideal.  Bowel regimen with 30 g fiber [name] day and fiber supplement as needed to avoid problems.  Return to clinic as needed.   Instructions discussed.  Followup with primary care physician for other health issues as would normally be done.  Questions answered.  The patient expressed understanding and appreciation

## 2013-05-06 ENCOUNTER — Ambulatory Visit (INDEPENDENT_AMBULATORY_CARE_PROVIDER_SITE_OTHER): Payer: 59 | Admitting: Internal Medicine

## 2013-05-06 ENCOUNTER — Encounter: Payer: Self-pay | Admitting: Internal Medicine

## 2013-05-06 VITALS — BP 115/73 | HR 82 | Temp 99.0°F | Resp 20 | Wt 238.0 lb

## 2013-05-06 DIAGNOSIS — H6691 Otitis media, unspecified, right ear: Secondary | ICD-10-CM

## 2013-05-06 DIAGNOSIS — H6690 Otitis media, unspecified, unspecified ear: Secondary | ICD-10-CM

## 2013-05-06 DIAGNOSIS — R059 Cough, unspecified: Secondary | ICD-10-CM

## 2013-05-06 DIAGNOSIS — J209 Acute bronchitis, unspecified: Secondary | ICD-10-CM

## 2013-05-06 DIAGNOSIS — R05 Cough: Secondary | ICD-10-CM

## 2013-05-06 MED ORDER — HYDROCOD POLST-CHLORPHEN POLST 10-8 MG/5ML PO LQCR: 5.0000 mL | Freq: Two times a day (BID) | ORAL | Status: DC | PRN

## 2013-05-06 MED ORDER — AZITHROMYCIN 250 MG PO TABS: ORAL_TABLET | ORAL | Status: DC

## 2013-05-06 NOTE — Progress Notes (Signed)
Subjective:    Patient ID: Stephanie [name], female    DOB: 03/28/1961, 52 y.o.   MRN: [id number]  HPI Stephanie [name] is here for acute visit  Began with sore throat 5 days ago now productive cough and R ear pain  No drainage no documented fever no chest pain or SOB  She is S/P lap cholecystectomy and doing fine  Allergies  Allergen Reactions  . Morphine And Related Other (See Comments)    headache   Past Medical History  Diagnosis Date  . Polycystic ovarian syndrome   . Allergy   . Hypertension   . Thyroid disease   . Obesity   . Herpes genitalia   . Cholelithiasis and acute cholecystitis without obstruction 04/08/2013   Past Surgical History  Procedure Laterality Date  . Breast reduction surgery  1994  . Myomectomy  11/2000  . Cesarean section  5/04  . Cholecystectomy N/A 04/07/2013    Procedure: LAPAROSCOPIC CHOLECYSTECTOMY;  Surgeon: Ardeth Sportsman, MD;  Location: [facility];  Service: General;  Laterality: N/A;  . Liver biopsy N/A 04/07/2013    Procedure: LIVER BIOPSY;  Surgeon: Ardeth Sportsman, MD;  Location: [facility];  Service: General;  Laterality: N/A;   History   Social History  . Marital Status: Single    Spouse Name: N/A    Number of Children: N/A  . Years of Education: N/A   Occupational History  . Not on file.   Social History Main Topics  . Smoking status: Never Smoker   . Smokeless tobacco: Never Used  . Alcohol Use: Yes     Comment: rare occasions  . Drug Use: No  . Sexual Activity: Yes    Birth Control/ Protection: Pill   Other Topics Concern  . Not on file   Social History Narrative  . No narrative on file   Family History  Problem Relation Age of Onset  . Cancer Mother     leukemia  . Hypertension Father   . Heart disease Maternal Grandmother   . Heart disease Maternal Grandfather   . Aneurysm Maternal Grandfather   . Aneurysm Paternal Grandmother    Patient Active Problem List   Diagnosis Date Noted  . Steatohepatitis, nonalcoholic -  mild 40/98/1191  . Obesity (BMI 30-39.9) 04/26/2013  . Cholelithiasis and acute cholecystitis s/p lap chole 04/07/2013 04/08/2013  . Bronchospasm 12/09/2012  . Dermatitis 12/09/2012  . Acne 03/06/2012  . Single cyst of left breast 04/23/2011  . Allergy   . Polycystic ovarian syndrome   . Hypertension   . Thyroid disease   . Herpes genitalia   . Obesity    Current Outpatient Prescriptions on File Prior to Visit  Medication Sig Dispense Refill  . amLODipine (NORVASC) 10 MG tablet Take 10 mg by mouth daily.      . benazepril (LOTENSIN) 20 MG tablet Take 20 mg by mouth daily.      . Diethylpropion HCl 25 MG TABS Take 25 mg by mouth daily.       Marland Kitchen levonorgestrel-ethinyl estradiol (TRIVORA, 28,) tablet Take 1 tablet by mouth daily.  1 Package  11  . levothyroxine (SYNTHROID, LEVOTHROID) 50 MCG tablet Take 50 mcg by mouth daily before breakfast.      . phentermine 37.5 MG capsule Take 37.5 mg by mouth every morning.      Marland Kitchen spironolactone (ALDACTONE) 50 MG tablet Take 50 mg by mouth 2 (two) times daily.       No current facility-administered medications  on file prior to visit.           Review of Systems    see HPI Objective:   Physical Exam Physical Exam  Constitutional: She is oriented to person, place, and time. She appears well-developed and well-nourished. She is cooperative.  HENT:  Head: Normocephalic and atraumatic.  Right Ear: red edematous TM  Poor landmards Left Ear: A middle ear effusion is present.  Nose: Mucosal edema present. Right sinus exhibits maxillary sinus tenderness. Left sinus exhibits maxillary sinus tenderness.  Mouth/Throat: Posterior oropharyngeal erythema present.  Serous effusion bilaterally  Eyes: Conjunctivae and EOM are normal. Pupils are equal, round, and reactive to light.  Neck: Neck supple. Carotid bruit is not present. No mass present.  Cardiovascular: Regular rhythm, normal heart sounds, intact distal pulses and normal pulses. Exam reveals  no gallop and no friction rub.  No murmur heard.  Pulmonary/Chest: Breath sounds normal. She has no wheezes. She has no rhonchi. She has no rales.  Neurological: She is alert and oriented to person, place, and time.  Skin: Skin is warm and dry. No abrasion, no bruising, no ecchymosis and no rash noted. No cyanosis. Nails show no clubbing.  Psychiatric: She has a normal mood and affect. Her speech is normal and behavior is normal.             Assessment & Plan:  R OM  Will give Zpak  Bronchitis   Cough  Will give Tussionex  q12h prn  See me as needed

## 2013-05-06 NOTE — Patient Instructions (Signed)
See me as needed 

## 2013-05-13 ENCOUNTER — Encounter: Payer: Self-pay | Admitting: Internal Medicine

## 2013-05-13 ENCOUNTER — Ambulatory Visit (INDEPENDENT_AMBULATORY_CARE_PROVIDER_SITE_OTHER): Payer: 59 | Admitting: Internal Medicine

## 2013-05-13 VITALS — BP 116/73 | HR 81 | Temp 98.1°F | Resp 18 | Wt 240.0 lb

## 2013-05-13 DIAGNOSIS — Z23 Encounter for immunization: Secondary | ICD-10-CM

## 2013-05-13 DIAGNOSIS — E079 Disorder of thyroid, unspecified: Secondary | ICD-10-CM

## 2013-05-13 DIAGNOSIS — E282 Polycystic ovarian syndrome: Secondary | ICD-10-CM

## 2013-05-13 DIAGNOSIS — K7581 Nonalcoholic steatohepatitis (NASH): Secondary | ICD-10-CM

## 2013-05-13 DIAGNOSIS — E010 Iodine-deficiency related diffuse (endemic) goiter: Secondary | ICD-10-CM

## 2013-05-13 DIAGNOSIS — K7689 Other specified diseases of liver: Secondary | ICD-10-CM

## 2013-05-13 DIAGNOSIS — I10 Essential (primary) hypertension: Secondary | ICD-10-CM

## 2013-05-13 DIAGNOSIS — Z139 Encounter for screening, unspecified: Secondary | ICD-10-CM

## 2013-05-13 DIAGNOSIS — Z1211 Encounter for screening for malignant neoplasm of colon: Secondary | ICD-10-CM

## 2013-05-13 DIAGNOSIS — E049 Nontoxic goiter, unspecified: Secondary | ICD-10-CM

## 2013-05-13 DIAGNOSIS — Z0000 Encounter for general adult medical examination without abnormal findings: Secondary | ICD-10-CM

## 2013-05-13 DIAGNOSIS — E669 Obesity, unspecified: Secondary | ICD-10-CM

## 2013-05-13 LAB — POCT URINALYSIS DIPSTICK
Bilirubin, UA: NEGATIVE
Glucose, UA: NEGATIVE
Ketones, UA: NEGATIVE
Leukocytes, UA: NEGATIVE
Nitrite, UA: NEGATIVE
Protein, UA: NEGATIVE
Spec Grav, UA: 1.015
Urobilinogen, UA: NEGATIVE
pH, UA: 6.5

## 2013-05-13 LAB — HEMOCCULT GUIAC POC 1CARD (OFFICE): Fecal Occult Blood, POC: NEGATIVE

## 2013-05-13 MED ORDER — TETANUS-DIPHTH-ACELL PERTUSSIS 5-2.5-18.5 LF-MCG/0.5 IM SUSP: 0.5000 mL | Freq: Once | INTRAMUSCULAR | Status: DC

## 2013-05-13 NOTE — Patient Instructions (Signed)
Will set up  Thyroid ultrasound test for 12/15 at the breast center when you have your mammogram  Call Dr. Luvenia Starch office for appointment for leg lesion    See me as needed  Call office in January for the shingles vaccine    Call when ready for your colonoscopy

## 2013-05-13 NOTE — Progress Notes (Signed)
Subjective:    Patient ID: Stephanie [name], female    DOB: 29-Dec-1960, 52 y.o.   MRN: [id number]  HPI  Stephanie [name] is here for CPE overall doing well  Feeling much better from respiratory illness  Cough less , ear feels better  She reports she is still working on her weight with Dr. Kinnie Scales.  Recent cholecystectomy so she is off track.    She no longer see Dr. Sharl Ma her endocrinologist.  She is euthyroid on her labs  She repeatedly declines colonoscopy despite my recommendation that it is necessary.  She has appt for upcoming mm.  Last pap gyn  At [facility]   12/2011 Negative    Allergies  Allergen Reactions  . Morphine And Related Other (See Comments)    headache   Past Medical History  Diagnosis Date  . Polycystic ovarian syndrome   . Allergy   . Hypertension   . Thyroid disease   . Obesity   . Herpes genitalia   . Cholelithiasis and acute cholecystitis without obstruction 04/08/2013   Past Surgical History  Procedure Laterality Date  . Breast reduction surgery  1994  . Myomectomy  11/2000  . Cesarean section  5/04  . Cholecystectomy N/A 04/07/2013    Procedure: LAPAROSCOPIC CHOLECYSTECTOMY;  Surgeon: Ardeth Sportsman, MD;  Location: [facility];  Service: General;  Laterality: N/A;  . Liver biopsy N/A 04/07/2013    Procedure: LIVER BIOPSY;  Surgeon: Ardeth Sportsman, MD;  Location: [facility];  Service: General;  Laterality: N/A;   History   Social History  . Marital Status: Single    Spouse Name: N/A    Number of Children: N/A  . Years of Education: N/A   Occupational History  . Not on file.   Social History Main Topics  . Smoking status: Never Smoker   . Smokeless tobacco: Never Used  . Alcohol Use: Yes     Comment: rare occasions  . Drug Use: No  . Sexual Activity: Yes    Birth Control/ Protection: Pill   Other Topics Concern  . Not on file   Social History Narrative  . No narrative on file   Family History  Problem Relation Age of Onset  . Cancer Mother     leukemia   . Hypertension Father   . Heart disease Maternal Grandmother   . Heart disease Maternal Grandfather   . Aneurysm Maternal Grandfather   . Aneurysm Paternal Grandmother    Patient Active Problem List   Diagnosis Date Noted  . Steatohepatitis, 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/26/2013  . Obesity (BMI 30-39.9) 04/26/2013  . Cholelithiasis and acute cholecystitis s/p lap chole 04/07/2013 04/08/2013  . Bronchospasm 12/09/2012  . Dermatitis 12/09/2012  . Acne 03/06/2012  . Single cyst of left breast 04/23/2011  . Allergy   . Polycystic ovarian syndrome   . Hypertension   . Thyroid disease   . Herpes genitalia   . Obesity    Current Outpatient Prescriptions on File Prior to Visit  Medication Sig Dispense Refill  . amLODipine (NORVASC) 10 MG tablet Take 10 mg by mouth daily.      . benazepril (LOTENSIN) 20 MG tablet Take 20 mg by mouth daily.      . Diethylpropion HCl 25 MG TABS Take 25 mg by mouth daily.       Marland Kitchen levonorgestrel-ethinyl estradiol (TRIVORA, 28,) tablet Take 1 tablet by mouth daily.  1 Package  11  . levothyroxine (SYNTHROID, LEVOTHROID) 50 MCG tablet Take 50  mcg by mouth daily before breakfast.      . phentermine 37.5 MG capsule Take 37.5 mg by mouth every morning.      Marland Kitchen spironolactone (ALDACTONE) 50 MG tablet Take 50 mg by mouth 2 (two) times daily.      . chlorpheniramine-HYDROcodone (TUSSIONEX PENNKINETIC ER) 10-8 MG/5ML LQCR Take 5 mLs by mouth every 12 (twelve) hours as needed for cough.  115 mL  0   No current facility-administered medications on file prior to visit.      Review of Systems  Respiratory: Negative for shortness of breath and wheezing.   Cardiovascular: Negative for chest pain, palpitations and leg swelling.  Gastrointestinal: Negative for abdominal pain.  All other systems reviewed and are negative.       Objective:   Physical Exam Physical Exam  Nursing note and vitals reviewed.  Constitutional: She is oriented to person, place, and time. She  appears well-developed and well-nourished.  HENT:  Head: Normocephalic and atraumatic.  Right Ear: Tympanic membrane and ear canal normal. No drainage. Tympanic membrane is not injected and not erythematous.  Left Ear: Tympanic membrane and ear canal normal. No drainage. Tympanic membrane is not injected and not erythematous.  Nose: Nose normal. Right sinus exhibits no maxillary sinus tenderness and no frontal sinus tenderness. Left sinus exhibits no maxillary sinus tenderness and no frontal sinus tenderness.  Mouth/Throat: Oropharynx is clear and moist. No oral lesions. No oropharyngeal exudate.  Eyes: Conjunctivae and EOM are normal. Pupils are equal, round, and reactive to light.  Neck: Normal range of motion. Neck supple. No JVD present. Carotid bruit is not present. Slight thyromegaly on exam  No discrete nodules.  Cardiovascular: Normal rate, regular rhythm, S1 normal, S2 normal and intact distal pulses. Exam reveals no gallop and no friction rub.  No murmur heard.  Pulses:  Carotid pulses are 2+ on the right side, and 2+ on the left side.  Dorsalis pedis pulses are 2+ on the right side, and 2+ on the left side.  No carotid bruit. No LE edema  Pulmonary/Chest: Breath sounds normal. She has no wheezes. She has no rales. She exhibits no tenderness.  Breast no discrete masses no nipple discharge no axillary adenopathy bilaterally Abdominal: Soft. Bowel sounds are normal. She exhibits no distension and no mass. There is no hepatosplenomegaly. There is no tenderness. There is no CVA tenderness.  Musculoskeletal: Normal range of motion.  No active synovitis to joints.  Lymphadenopathy:  She has no cervical adenopathy.  She has no axillary adenopathy.  Right: No inguinal and no supraclavicular adenopathy present.  Left: No inguinal and no supraclavicular adenopathy present.  Neurological: She is alert and oriented to person, place, and time. She has normal strength and normal reflexes. She  displays no tremor. No cranial nerve deficit or sensory deficit. Coordination and gait normal.  Skin: Skin is warm and dry. No rash noted. No cyanosis. Nails show no clubbing.   She has flesh colored lesion Left lower leg Psychiatric: She has a normal mood and affect. Her speech is normal and behavior is normal. Cognition and memory are normal.           Assessment & Plan:  Health maintenance  tdap today.  Will call for Zostavax.  Pt repeatedly declines colonoscopy   Will get mm on 12/15  Slight thyromegaly on exam:  Will get thyroid ultrasound.     HTN  Continue meds  Skin lesion  She has seen dR. Tiburcio Pea to  call for appt to check leg lesion  Obesity  DASH diet  PCOS

## 2013-05-17 ENCOUNTER — Ambulatory Visit
Admission: RE | Admit: 2013-05-17 | Discharge: 2013-05-17 | Disposition: A | Discharge status: Home / Self Care | Payer: 59 | Source: Ambulatory Visit

## 2013-05-17 DIAGNOSIS — Z1231 Encounter for screening mammogram for malignant neoplasm of breast: Secondary | ICD-10-CM

## 2013-05-19 ENCOUNTER — Ambulatory Visit (HOSPITAL_BASED_OUTPATIENT_CLINIC_OR_DEPARTMENT_OTHER)
Admission: RE | Admit: 2013-05-19 | Discharge: 2013-05-19 | Disposition: A | Discharge status: Home / Self Care | Payer: 59 | Source: Ambulatory Visit | Attending: Internal Medicine | Admitting: Internal Medicine

## 2013-05-19 DIAGNOSIS — E042 Nontoxic multinodular goiter: Secondary | ICD-10-CM | POA: Insufficient documentation

## 2013-05-19 DIAGNOSIS — E039 Hypothyroidism, unspecified: Secondary | ICD-10-CM | POA: Insufficient documentation

## 2013-05-19 DIAGNOSIS — E010 Iodine-deficiency related diffuse (endemic) goiter: Secondary | ICD-10-CM

## 2013-05-24 ENCOUNTER — Telehealth: Payer: Self-pay | Admitting: Internal Medicine

## 2013-05-24 DIAGNOSIS — E041 Nontoxic single thyroid nodule: Secondary | ICD-10-CM | POA: Insufficient documentation

## 2013-05-24 NOTE — Telephone Encounter (Signed)
Spoke with pt and informed of thyroid ultrasound results.    She has 2.6 cm solid nodule in isthmus.   I advised biopsy and will she prefers it to be done on !/2 or the follow ing week

## 2013-06-10 ENCOUNTER — Other Ambulatory Visit: Payer: 59

## 2013-06-10 ENCOUNTER — Ambulatory Visit
Admission: RE | Admit: 2013-06-10 | Discharge: 2013-06-10 | Disposition: A | Discharge status: Home / Self Care | Payer: 59 | Source: Ambulatory Visit | Attending: Internal Medicine | Admitting: Internal Medicine

## 2013-06-10 ENCOUNTER — Other Ambulatory Visit (HOSPITAL_COMMUNITY)
Admission: RE | Admit: 2013-06-10 | Discharge: 2013-06-10 | Disposition: A | Discharge status: Home / Self Care | Payer: 59 | Source: Ambulatory Visit | Attending: Interventional Radiology | Admitting: Interventional Radiology

## 2013-06-10 DIAGNOSIS — E041 Nontoxic single thyroid nodule: Secondary | ICD-10-CM

## 2013-06-14 ENCOUNTER — Telehealth: Payer: Self-pay | Admitting: Internal Medicine

## 2013-06-14 NOTE — Telephone Encounter (Signed)
Spoke with pt and informed of thyroid biopsy results.  Advised to make appt. With Dr. Buddy Duty her endocrinologist to discuss options  .  Will fax results to him

## 2013-06-14 NOTE — Telephone Encounter (Signed)
Left message on voicemail for pt to return call regarding thyroid biopsy results

## 2013-09-17 ENCOUNTER — Ambulatory Visit (INDEPENDENT_AMBULATORY_CARE_PROVIDER_SITE_OTHER): Payer: 59 | Admitting: *Deleted

## 2013-09-17 DIAGNOSIS — Z23 Encounter for immunization: Secondary | ICD-10-CM

## 2013-09-17 MED ORDER — ZOSTER VACCINE LIVE 19400 UNT/0.65ML ~~LOC~~ SOLR: 0.6500 mL | Freq: Once | SUBCUTANEOUS | Status: DC

## 2013-10-09 ENCOUNTER — Other Ambulatory Visit: Payer: Self-pay | Admitting: Internal Medicine

## 2013-10-11 NOTE — Telephone Encounter (Signed)
Refill request

## 2013-10-12 NOTE — Telephone Encounter (Signed)
Notified pt that she will need to have labs redrawn soon.

## 2013-10-12 NOTE — Telephone Encounter (Signed)
Stephanie [name]  Call pt and let her know that I sent refill for her Lotensin but I want to check her CMP and check her K with this  Have her come in anytime for CMP

## 2013-11-02 ENCOUNTER — Other Ambulatory Visit: Payer: Self-pay | Admitting: *Deleted

## 2013-11-02 DIAGNOSIS — Z0000 Encounter for general adult medical examination without abnormal findings: Secondary | ICD-10-CM

## 2013-11-02 LAB — CBC WITH DIFFERENTIAL/PLATELET
Basophils Absolute: 0 10*3/uL (ref 0.0–0.1)
Basophils Relative: 0 % (ref 0–1)
Eosinophils Absolute: 0.2 10*3/uL (ref 0.0–0.7)
Eosinophils Relative: 2 % (ref 0–5)
HCT: 38.1 % (ref 36.0–46.0)
Hemoglobin: 13.5 g/dL (ref 12.0–15.0)
Lymphocytes Relative: 30 % (ref 12–46)
Lymphs Abs: 2.5 10*3/uL (ref 0.7–4.0)
MCH: 31.1 pg (ref 26.0–34.0)
MCHC: 35.4 g/dL (ref 30.0–36.0)
MCV: 87.8 fL (ref 78.0–100.0)
Monocytes Absolute: 0.4 10*3/uL (ref 0.1–1.0)
Monocytes Relative: 5 % (ref 3–12)
Neutro Abs: 5.2 10*3/uL (ref 1.7–7.7)
Neutrophils Relative %: 63 % (ref 43–77)
Platelets: 336 10*3/uL (ref 150–400)
RBC: 4.34 MIL/uL (ref 3.87–5.11)
RDW: 13.2 % (ref 11.5–15.5)
WBC: 8.3 10*3/uL (ref 4.0–10.5)

## 2013-11-02 LAB — LIPID PANEL
Cholesterol: 173 mg/dL (ref 0–200)
HDL: 57 mg/dL (ref >39)
LDL Cholesterol: 94 mg/dL (ref 0–99)
Total CHOL/HDL Ratio: 3 Ratio
Triglycerides: 112 mg/dL (ref <150)
VLDL: 22 mg/dL (ref 0–40)

## 2013-11-02 LAB — COMPREHENSIVE METABOLIC PANEL
ALT: 15 U/L (ref 0–35)
AST: 15 U/L (ref 0–37)
Albumin: 3.7 g/dL (ref 3.5–5.2)
Alkaline Phosphatase: 43 U/L (ref 39–117)
BUN: 13 mg/dL (ref 6–23)
CO2: 24 mEq/L (ref 19–32)
Calcium: 9 mg/dL (ref 8.4–10.5)
Chloride: 104 mEq/L (ref 96–112)
Creat: 0.62 mg/dL (ref 0.50–1.10)
Glucose, Bld: 120 mg/dL — ABNORMAL HIGH (ref 70–99)
Potassium: 3.9 mEq/L (ref 3.5–5.3)
Sodium: 138 mEq/L (ref 135–145)
Total Bilirubin: 0.4 mg/dL (ref 0.2–1.2)
Total Protein: 6.3 g/dL (ref 6.0–8.3)

## 2013-11-02 LAB — TSH: TSH: 1.949 u[IU]/mL (ref 0.350–4.500)

## 2013-11-03 LAB — VITAMIN D 25 HYDROXY (VIT D DEFICIENCY, FRACTURES): Vit D, 25-Hydroxy: 36 ng/mL (ref 30–89)

## 2013-11-04 ENCOUNTER — Encounter: Payer: Self-pay | Admitting: Internal Medicine

## 2013-11-04 ENCOUNTER — Ambulatory Visit (INDEPENDENT_AMBULATORY_CARE_PROVIDER_SITE_OTHER): Payer: 59 | Admitting: Internal Medicine

## 2013-11-04 VITALS — BP 107/75 | HR 73 | Temp 98.4°F | Resp 18 | Ht 65.0 in | Wt 243.0 lb

## 2013-11-04 DIAGNOSIS — L259 Unspecified contact dermatitis, unspecified cause: Secondary | ICD-10-CM

## 2013-11-04 MED ORDER — MOMETASONE FUROATE 0.1 % EX CREA: 1.0000 "application " | TOPICAL_CREAM | Freq: Every day | CUTANEOUS | Status: DC

## 2013-11-04 MED ORDER — PREDNISONE 20 MG PO TABS: ORAL_TABLET | ORAL | Status: DC

## 2013-11-04 MED ORDER — METHYLPREDNISOLONE ACETATE 40 MG/ML IJ SUSP
160.0000 mg | Freq: Once | INTRAMUSCULAR | Status: AC
  Administered 2013-11-04: 160 mg via INTRAMUSCULAR

## 2013-11-04 NOTE — Patient Instructions (Signed)
Schedule  CPE in December  To pharmacy   Call if rash not better

## 2013-11-04 NOTE — Progress Notes (Signed)
Subjective:    Patient ID: Stephanie [name], female    DOB: [birth year], 53 y.o.   MRN: [id number]  HPI Stephanie [name] is here for acute visit.    Has itchy red rash over legs and now has spread to arms.      Was outside with her dog and came in 2 weeeks ago with acute itching that evening.  No facial rash .  No fever no Headache.  No insects or ticks found  Allergies  Allergen Reactions  . Morphine And Related Other (See Comments)    headache   Past Medical History  Diagnosis Date  . Polycystic ovarian syndrome   . Allergy   . Hypertension   . Thyroid disease   . Obesity   . Herpes genitalia   . Cholelithiasis and acute cholecystitis without obstruction 04/08/2013   Past Surgical History  Procedure Laterality Date  . Breast reduction surgery  1994  . Myomectomy  11/2000  . Cesarean section  5/04  . Cholecystectomy N/A 04/07/2013    Procedure: LAPAROSCOPIC CHOLECYSTECTOMY;  Surgeon: Adin Hector, MD;  Location: [facility];  Service: General;  Laterality: N/A;  . Liver biopsy N/A 04/07/2013    Procedure: LIVER BIOPSY;  Surgeon: Adin Hector, MD;  Location: [facility];  Service: General;  Laterality: N/A;   History   Social History  . Marital Status: Single    Spouse Name: N/A    Number of Children: N/A  . Years of Education: N/A   Occupational History  . Not on file.   Social History Main Topics  . Smoking status: Never Smoker   . Smokeless tobacco: Never Used  . Alcohol Use: Yes     Comment: rare occasions  . Drug Use: No  . Sexual Activity: Yes    Birth Control/ Protection: Pill   Other Topics Concern  . Not on file   Social History Narrative  . No narrative on file   Family History  Problem Relation Age of Onset  . Cancer Mother     leukemia  . Hypertension Father   . Heart disease Maternal Grandmother   . Heart disease Maternal Grandfather   . Aneurysm Maternal Grandfather   . Aneurysm Paternal Grandmother    Patient Active Problem List   Diagnosis Date  Noted  . Thyroid nodule 05/24/2013  . Steatohepatitis, nonalcoholic - mild 17/61/6073  . Obesity (BMI 30-39.9) 04/26/2013  . Cholelithiasis and acute cholecystitis s/p lap chole 04/07/2013 04/08/2013  . Bronchospasm 12/09/2012  . Dermatitis 12/09/2012  . Acne 03/06/2012  . Single cyst of left breast 04/23/2011  . Allergy   . Polycystic ovarian syndrome   . Hypertension   . Thyroid disease   . Herpes genitalia   . Obesity    Current Outpatient Prescriptions on File Prior to Visit  Medication Sig Dispense Refill  . amLODipine (NORVASC) 10 MG tablet Take 10 mg by mouth daily.      . benazepril (LOTENSIN) 20 MG tablet Take 20 mg by mouth daily.      Marland Kitchen levonorgestrel-ethinyl estradiol (TRIVORA, 28,) tablet Take 1 tablet by mouth daily.  1 Package  11  . levothyroxine (SYNTHROID, LEVOTHROID) 50 MCG tablet Take 50 mcg by mouth daily before breakfast.      . phentermine 37.5 MG capsule Take 37.5 mg by mouth every morning.      Marland Kitchen spironolactone (ALDACTONE) 50 MG tablet Take 50 mg by mouth 2 (two) times daily.      Marland Kitchen  chlorpheniramine-HYDROcodone (TUSSIONEX PENNKINETIC ER) 10-8 MG/5ML LQCR Take 5 mLs by mouth every 12 (twelve) hours as needed for cough.  115 mL  0  . Diethylpropion HCl 25 MG TABS Take 25 mg by mouth daily.        No current facility-administered medications on file prior to visit.       Review of Systems See HPI    Objective:   Physical Exam Physical Exam  Nursing note and vitals reviewed.  Constitutional: She is oriented to person, place, and time. She appears well-developed and well-nourished.  HENT:  Head: Normocephalic and atraumatic.  Cardiovascular: Normal rate and regular rhythm. Exam reveals no gallop and no friction rub.  No murmur heard.  Pulmonary/Chest: Breath sounds normal. She has no wheezes. She has no rales.  Neurological: She is alert and oriented to person, place, and time.  Skin: Skin is warm and dry.  Rash  Maculopapular bistery rash both lower  legs and R forearm  No facial lesion Psychiatric: She has a normal mood and affect. Her behavior is normal.         Assessment & Plan:  Plant dermatitis:  Will give depo-medrol 160mg  in office with prednisone 60 mg taper.  Elocon 0.1% bid do not apply to face  Itching  See above

## 2013-11-11 ENCOUNTER — Telehealth: Payer: Self-pay | Admitting: *Deleted

## 2013-11-11 DIAGNOSIS — I8390 Asymptomatic varicose veins of unspecified lower extremity: Secondary | ICD-10-CM

## 2013-11-11 NOTE — Telephone Encounter (Signed)
[facility]  I like Dr. Donnetta Hutching  I put order in chart

## 2013-11-11 NOTE — Telephone Encounter (Signed)
Pt would like a referral to a vein specialist.

## 2013-11-12 NOTE — Telephone Encounter (Signed)
Left VMM regarding referral

## 2014-01-21 ENCOUNTER — Other Ambulatory Visit: Payer: Self-pay | Admitting: Internal Medicine

## 2014-01-24 NOTE — Telephone Encounter (Signed)
Requested Medications     Medication name:  Name from pharmacy:  benazepril (LOTENSIN) 20 MG tablet  BENAZEPRIL HCL TABS 20MG     The source prescription has been discontinued.    Sig: TAKE 1 TABLET DAILY    Dispense: 90 tablet Start: 01/21/2014  Class: Normal    Requested on: 01/21/2014    Originally ordered on: 04/23/2011 Last refill: 10/13/2013 Order History and Details

## 2014-03-07 ENCOUNTER — Telehealth: Payer: Self-pay

## 2014-03-07 ENCOUNTER — Telehealth: Payer: Self-pay | Admitting: *Deleted

## 2014-03-07 MED ORDER — LEVONORG-ETH ESTRAD TRIPHASIC PO TABS: 1.0000 | ORAL_TABLET | Freq: Every day | ORAL | Status: DC

## 2014-03-07 NOTE — Telephone Encounter (Signed)
Stephanie [name] [phone] (M)  [name] called she needs to get her birth control pills, she thought she had enough refills for a year but it was only for 6 months.. She would like for [place] to send a 90 day supply into [facility] for 6 months and she would also like for [place] to send in a 30 day supply to [facility] at [facility] because she is completely out. I do see where she has a CPE in December.

## 2014-03-07 NOTE — Telephone Encounter (Signed)
Stephanie [name] called she needs to get her birth control pills, she thought she had enough refills for a year but it was only for 6 months.. She would like for [place] to send a 90 day supply into [facility] for 6 months and she would also like for [place] to send in a 30 day supply to [facility] at [facility] because she is completely out. I do see where she has a CPE in [employer]

## 2014-03-08 ENCOUNTER — Ambulatory Visit: Payer: 59

## 2014-03-09 ENCOUNTER — Other Ambulatory Visit: Payer: Self-pay | Admitting: Internal Medicine

## 2014-03-09 NOTE — Telephone Encounter (Signed)
Refill request

## 2014-03-10 ENCOUNTER — Other Ambulatory Visit: Payer: Self-pay | Admitting: *Deleted

## 2014-03-10 ENCOUNTER — Ambulatory Visit (INDEPENDENT_AMBULATORY_CARE_PROVIDER_SITE_OTHER): Payer: 59 | Admitting: *Deleted

## 2014-03-10 DIAGNOSIS — Z23 Encounter for immunization: Secondary | ICD-10-CM

## 2014-03-10 MED ORDER — AMLODIPINE BESYLATE 10 MG PO TABS: 10.0000 mg | ORAL_TABLET | Freq: Every day | ORAL | Status: DC

## 2014-03-10 MED ORDER — LEVOTHYROXINE SODIUM 50 MCG PO TABS: 50.0000 ug | ORAL_TABLET | Freq: Every day | ORAL | Status: DC

## 2014-03-10 NOTE — Telephone Encounter (Signed)
Refill request for 90 day supply to [facility]

## 2014-04-04 ENCOUNTER — Encounter: Payer: Self-pay | Admitting: Internal Medicine

## 2014-04-05 ENCOUNTER — Other Ambulatory Visit: Payer: Self-pay | Admitting: Internal Medicine

## 2014-04-05 DIAGNOSIS — E049 Nontoxic goiter, unspecified: Secondary | ICD-10-CM

## 2014-04-08 ENCOUNTER — Other Ambulatory Visit: Payer: 59

## 2014-05-06 ENCOUNTER — Ambulatory Visit
Admission: RE | Admit: 2014-05-06 | Discharge: 2014-05-06 | Disposition: A | Discharge status: Home / Self Care | Payer: 59 | Source: Ambulatory Visit | Attending: Internal Medicine | Admitting: Internal Medicine

## 2014-05-06 DIAGNOSIS — E049 Nontoxic goiter, unspecified: Secondary | ICD-10-CM

## 2014-05-17 ENCOUNTER — Other Ambulatory Visit: Payer: Self-pay | Admitting: Internal Medicine

## 2014-05-17 DIAGNOSIS — E042 Nontoxic multinodular goiter: Secondary | ICD-10-CM

## 2014-05-18 ENCOUNTER — Other Ambulatory Visit (HOSPITAL_COMMUNITY)
Admission: RE | Admit: 2014-05-18 | Discharge: 2014-05-18 | Disposition: A | Discharge status: Home / Self Care | Payer: 59 | Source: Ambulatory Visit | Attending: Interventional Radiology | Admitting: Interventional Radiology

## 2014-05-18 ENCOUNTER — Encounter: Payer: Self-pay | Admitting: Internal Medicine

## 2014-05-18 ENCOUNTER — Ambulatory Visit (INDEPENDENT_AMBULATORY_CARE_PROVIDER_SITE_OTHER): Payer: 59 | Admitting: Internal Medicine

## 2014-05-18 ENCOUNTER — Ambulatory Visit
Admission: RE | Admit: 2014-05-18 | Discharge: 2014-05-18 | Disposition: A | Discharge status: Home / Self Care | Payer: 59 | Source: Ambulatory Visit | Attending: Internal Medicine | Admitting: Internal Medicine

## 2014-05-18 VITALS — BP 106/73 | HR 74 | Resp 16 | Ht 65.0 in | Wt 243.0 lb

## 2014-05-18 DIAGNOSIS — K7581 Nonalcoholic steatohepatitis (NASH): Secondary | ICD-10-CM

## 2014-05-18 DIAGNOSIS — E042 Nontoxic multinodular goiter: Secondary | ICD-10-CM

## 2014-05-18 DIAGNOSIS — Z0000 Encounter for general adult medical examination without abnormal findings: Secondary | ICD-10-CM

## 2014-05-18 DIAGNOSIS — I10 Essential (primary) hypertension: Secondary | ICD-10-CM

## 2014-05-18 DIAGNOSIS — E669 Obesity, unspecified: Secondary | ICD-10-CM

## 2014-05-18 DIAGNOSIS — E041 Nontoxic single thyroid nodule: Secondary | ICD-10-CM

## 2014-05-18 DIAGNOSIS — Z0189 Encounter for other specified special examinations: Secondary | ICD-10-CM

## 2014-05-18 LAB — BASIC METABOLIC PANEL
BUN: 13 mg/dL (ref 6–23)
CO2: 26 mEq/L (ref 19–32)
Calcium: 8.3 mg/dL — ABNORMAL LOW (ref 8.4–10.5)
Chloride: 101 mEq/L (ref 96–112)
Creat: 0.67 mg/dL (ref 0.50–1.10)
Glucose, Bld: 93 mg/dL (ref 70–99)
Potassium: 3.8 mEq/L (ref 3.5–5.3)
Sodium: 136 mEq/L (ref 135–145)

## 2014-05-18 LAB — POCT URINALYSIS DIPSTICK
Bilirubin, UA: NEGATIVE
Blood, UA: NEGATIVE
Glucose, UA: NEGATIVE
Ketones, UA: NEGATIVE
Leukocytes, UA: NEGATIVE
Nitrite, UA: NEGATIVE
Protein, UA: NEGATIVE
Spec Grav, UA: 1.01
Urobilinogen, UA: NEGATIVE
pH, UA: 6

## 2014-05-18 MED ORDER — AZITHROMYCIN 250 MG PO TABS: ORAL_TABLET | ORAL | Status: DC

## 2014-05-18 MED ORDER — HYDROCOD POLST-CHLORPHEN POLST 10-8 MG/5ML PO LQCR: 5.0000 mL | Freq: Two times a day (BID) | ORAL | Status: DC | PRN

## 2014-05-18 NOTE — Patient Instructions (Signed)
See me as needed 

## 2014-05-18 NOTE — Progress Notes (Signed)
Subjective:    Patient ID: Stephanie [name], female    DOB: 1960/08/26, 53 y.o.   MRN: [id number]  HPI 11/2013  Plant dermatitis: Will give depo-medrol 160mg  in office with prednisone 60 mg taper. Elocon 0.1% bid do not apply to face  Itching See above  TODAY  Stephanie [name] is here for CPE  5 days of cough productive white mucous but seems to be getting better now.    She tells me she has seen Dr. Buddy [name] and has a new thyroid nodule and will be going for a bx today   HTN:  Tolerating meds   HM:  UTD with vaccines  Needs mm,  Pap done 2013,  Non-smoker   Allergies  Allergen Reactions  . Morphine And Related Other (See Comments)    headache   Past Medical History  Diagnosis Date  . Polycystic ovarian syndrome   . Allergy   . Hypertension   . Thyroid disease   . Obesity   . Herpes genitalia   . Cholelithiasis and acute cholecystitis without obstruction 04/08/2013   Past Surgical History  Procedure Laterality Date  . Breast reduction surgery  1994  . Myomectomy  11/2000  . Cesarean section  5/04  . Cholecystectomy N/A 04/07/2013    Procedure: LAPAROSCOPIC CHOLECYSTECTOMY;  Surgeon: Stephanie Hector, MD;  Location: [facility];  Service: General;  Laterality: N/A;  . Liver biopsy N/A 04/07/2013    Procedure: LIVER BIOPSY;  Surgeon: Stephanie Hector, MD;  Location: [facility];  Service: General;  Laterality: N/A;   History   Social History  . Marital Status: Single    Spouse Name: N/A    Number of Children: N/A  . Years of Education: N/A   Occupational History  . Not on file.   Social History Main Topics  . Smoking status: Never Smoker   . Smokeless tobacco: Never Used  . Alcohol Use: Yes     Comment: rare occasions  . Drug Use: No  . Sexual Activity: Yes    Birth Control/ Protection: Pill   Other Topics Concern  . Not on file   Social History Narrative   Family History  Problem Relation Age of Onset  . Cancer Mother     leukemia  . Hypertension Father   . Heart  disease Maternal Grandmother   . Heart disease Maternal Grandfather   . Aneurysm Maternal Grandfather   . Aneurysm Paternal Grandmother    Patient Active Problem List   Diagnosis Date Noted  . Thyroid nodule 05/24/2013  . Steatohepatitis, nonalcoholic - mild 43/32/9518  . Obesity (BMI 30-39.9) 04/26/2013  . Cholelithiasis and acute cholecystitis s/p lap chole 04/07/2013 04/08/2013  . Bronchospasm 12/09/2012  . Dermatitis 12/09/2012  . Acne 03/06/2012  . Single cyst of left breast 04/23/2011  . Allergy   . Polycystic ovarian syndrome   . Hypertension   . Thyroid disease   . Herpes genitalia   . Obesity    Current Outpatient Prescriptions on File Prior to Visit  Medication Sig Dispense Refill  . amLODipine (NORVASC) 10 MG tablet TAKE 1 TABLET DAILY 90 tablet 2  . amLODipine (NORVASC) 10 MG tablet Take 1 tablet (10 mg total) by mouth daily. 90 tablet 3  . benazepril (LOTENSIN) 20 MG tablet Take 20 mg by mouth daily.    . benazepril (LOTENSIN) 20 MG tablet TAKE 1 TABLET DAILY 90 tablet 1  . chlorpheniramine-HYDROcodone (TUSSIONEX PENNKINETIC ER) 10-8 MG/5ML LQCR Take 5 mLs by mouth every  12 (twelve) hours as needed for cough. 115 mL 0  . Diethylpropion HCl 25 MG TABS Take 25 mg by mouth daily.     Marland Kitchen levonorgestrel-ethinyl estradiol (TRIVORA, 28,) tablet Take 1 tablet by mouth daily. 3 Package 0  . levonorgestrel-ethinyl estradiol (TRIVORA, 28,) tablet Take 1 tablet by mouth daily. 1 Package 0  . levothyroxine (SYNTHROID, LEVOTHROID) 50 MCG tablet Take 1 tablet (50 mcg total) by mouth daily before breakfast. 90 tablet 1  . mometasone (ELOCON) 0.1 % cream Apply 1 application topically daily. Apply bid . Do not apply to face 45 g 0  . phentermine 37.5 MG capsule Take 37.5 mg by mouth every morning.    . predniSONE (DELTASONE) 20 MG tablet Take 3 tablets daily for 3 days then 2 tablets for 3 days then 1 tablet for 3 days then stop 18 tablet 0  . spironolactone (ALDACTONE) 50 MG tablet Take  50 mg by mouth 2 (two) times daily.     No current facility-administered medications on file prior to visit.       Review of Systems  Respiratory: Negative for cough, chest tightness, shortness of breath and wheezing.   Cardiovascular: Negative for chest pain, palpitations and leg swelling.  Gastrointestinal: Negative for abdominal pain.  All other systems reviewed and are negative.      Objective:   Physical Exam Physical Exam  Nursing note and vitals reviewed.  Constitutional: She is oriented to person, place, and time. She appears well-developed and well-nourished.  HENT:  Head: Normocephalic and atraumatic.  Right Ear: Tympanic membrane and ear canal normal. No drainage. Tympanic membrane is not injected and not erythematous.  Left Ear: Tympanic membrane and ear canal normal. No drainage. Tympanic membrane is not injected and not erythematous.  Nose: Nose normal. Right sinus exhibits no maxillary sinus tenderness and no frontal sinus tenderness. Left sinus exhibits no maxillary sinus tenderness and no frontal sinus tenderness.  Mouth/Throat: Oropharynx is clear and moist. No oral lesions. No oropharyngeal exudate.  Eyes: Conjunctivae and EOM are normal. Pupils are equal, round, and reactive to light.  Neck: Normal range of motion. Neck supple. No JVD present. Carotid bruit is not present. No mass and no thyromegaly present.  Cardiovascular: Normal rate, regular rhythm, S1 normal, S2 normal and intact distal pulses. Exam reveals no gallop and no friction rub.  No murmur heard.  Pulses:  Carotid pulses are 2+ on the right side, and 2+ on the left side.  Dorsalis pedis pulses are 2+ on the right side, and 2+ on the left side.  No carotid bruit. No LE edema  Pulmonary/Chest: Breath sounds normal. She has no wheezes. She has no rales. She exhibits no tenderness.  Abdominal: Soft. Bowel sounds are normal. She exhibits no distension and no mass. There is no hepatosplenomegaly. There  is no tenderness. There is no CVA tenderness.  Breast no discrete mass no nipple discharge no axillary adenopathy bilaterally   Rectal no mass guaiac neg Musculoskeletal: Normal range of motion.  No active synovitis to joints.  Lymphadenopathy:  She has no cervical adenopathy.  She has no axillary adenopathy.  Right: No inguinal and no supraclavicular adenopathy present.  Left: No inguinal and no supraclavicular adenopathy present.  Neurological: She is alert and oriented to person, place, and time. She has normal strength and normal reflexes. She displays no tremor. No cranial nerve deficit or sensory deficit. Coordination and gait normal.  Skin: Skin is warm and dry. No rash noted. No cyanosis.  Nails show no clubbing.  Psychiatric: She has a normal mood and affect. Her speech is normal and behavior is normal. Cognition and memory are normal.       Assessment & Plan:  HM:  Non smoker  UTD with vaccines  Advised to schedule mm.  Advised screening colonoscopy Dr. Earlean Shawl  HTN:  continue meds  Bronchitis  RS tussionex and z-pak to be held and take if not getting better  Hypothyroidism / new nodule  Thyroid bx pending today  Dr. Buddy [name]  Steatohepatitis  Last lfts neg   Obesity :    Managed Dr. Earlean Shawl

## 2014-05-19 ENCOUNTER — Telehealth: Payer: Self-pay | Admitting: *Deleted

## 2014-05-19 NOTE — Telephone Encounter (Signed)
I spoke with Stephanie [name] and gave her lab results-eh

## 2014-05-19 NOTE — Telephone Encounter (Signed)
-----   Message from Lanice Shirts, MD sent at 05/19/2014 11:02 AM EST ----- Call pt and let her know calcium is on low end of normal .  Just take calcium 1000 mg daily with her vitamin D

## 2014-05-25 ENCOUNTER — Other Ambulatory Visit: Payer: Self-pay

## 2014-05-25 DIAGNOSIS — Z9889 Other specified postprocedural states: Secondary | ICD-10-CM

## 2014-05-25 DIAGNOSIS — Z1231 Encounter for screening mammogram for malignant neoplasm of breast: Secondary | ICD-10-CM

## 2014-06-02 ENCOUNTER — Ambulatory Visit
Admission: RE | Admit: 2014-06-02 | Discharge: 2014-06-02 | Disposition: A | Discharge status: Home / Self Care | Payer: 59 | Source: Ambulatory Visit

## 2014-06-02 DIAGNOSIS — Z9889 Other specified postprocedural states: Secondary | ICD-10-CM

## 2014-06-02 DIAGNOSIS — Z1231 Encounter for screening mammogram for malignant neoplasm of breast: Secondary | ICD-10-CM

## 2014-06-14 ENCOUNTER — Encounter (INDEPENDENT_AMBULATORY_CARE_PROVIDER_SITE_OTHER): Payer: Self-pay

## 2014-06-16 ENCOUNTER — Encounter: Payer: Self-pay | Admitting: *Deleted

## 2014-06-23 ENCOUNTER — Other Ambulatory Visit: Payer: Self-pay | Admitting: *Deleted

## 2014-06-23 MED ORDER — LEVONORG-ETH ESTRAD TRIPHASIC PO TABS: 1.0000 | ORAL_TABLET | Freq: Every day | ORAL | Status: DC

## 2014-06-23 NOTE — Telephone Encounter (Signed)
Refill refill

## 2014-07-17 ENCOUNTER — Other Ambulatory Visit: Payer: Self-pay | Admitting: Internal Medicine

## 2014-07-19 NOTE — Telephone Encounter (Signed)
Refill request

## 2014-08-10 ENCOUNTER — Encounter: Payer: Self-pay | Admitting: *Deleted

## 2014-09-19 ENCOUNTER — Encounter: Payer: Self-pay | Admitting: *Deleted

## 2014-10-03 ENCOUNTER — Other Ambulatory Visit: Payer: Self-pay | Admitting: *Deleted

## 2014-10-03 MED ORDER — AMLODIPINE BESYLATE 10 MG PO TABS: 10.0000 mg | ORAL_TABLET | Freq: Every day | ORAL | Status: DC

## 2014-10-03 MED ORDER — BENAZEPRIL HCL 20 MG PO TABS: 20.0000 mg | ORAL_TABLET | Freq: Every day | ORAL | Status: AC

## 2014-10-03 MED ORDER — LEVONORG-ETH ESTRAD TRIPHASIC PO TABS: 1.0000 | ORAL_TABLET | Freq: Every day | ORAL | Status: DC

## 2014-10-03 NOTE — Telephone Encounter (Signed)
Refill request

## 2015-05-11 ENCOUNTER — Other Ambulatory Visit: Payer: Self-pay | Admitting: Internal Medicine

## 2015-05-11 DIAGNOSIS — E042 Nontoxic multinodular goiter: Secondary | ICD-10-CM

## 2015-05-15 ENCOUNTER — Ambulatory Visit
Admission: RE | Admit: 2015-05-15 | Discharge: 2015-05-15 | Disposition: A | Discharge status: Home / Self Care | Payer: 59 | Source: Ambulatory Visit | Attending: Internal Medicine | Admitting: Internal Medicine

## 2015-05-15 DIAGNOSIS — E042 Nontoxic multinodular goiter: Secondary | ICD-10-CM

## 2015-05-16 ENCOUNTER — Other Ambulatory Visit: Payer: Self-pay

## 2015-05-19 ENCOUNTER — Ambulatory Visit: Payer: Self-pay

## 2015-05-19 ENCOUNTER — Ambulatory Visit: Payer: 59

## 2015-05-22 ENCOUNTER — Encounter: Payer: 59 | Admitting: Internal Medicine

## 2015-06-22 ENCOUNTER — Other Ambulatory Visit: Payer: Self-pay

## 2015-06-22 DIAGNOSIS — Z1231 Encounter for screening mammogram for malignant neoplasm of breast: Secondary | ICD-10-CM

## 2015-07-10 ENCOUNTER — Other Ambulatory Visit (HOSPITAL_COMMUNITY)
Admission: RE | Admit: 2015-07-10 | Discharge: 2015-07-10 | Disposition: A | Discharge status: Home / Self Care | Payer: 59 | Source: Ambulatory Visit | Attending: Internal Medicine | Admitting: Internal Medicine

## 2015-07-10 DIAGNOSIS — Z01419 Encounter for gynecological examination (general) (routine) without abnormal findings: Secondary | ICD-10-CM | POA: Insufficient documentation

## 2015-07-10 DIAGNOSIS — Z1151 Encounter for screening for human papillomavirus (HPV): Secondary | ICD-10-CM | POA: Insufficient documentation

## 2015-07-11 ENCOUNTER — Other Ambulatory Visit: Payer: Self-pay | Admitting: Internal Medicine

## 2015-07-13 LAB — CYTOLOGY - PAP

## 2015-07-14 ENCOUNTER — Ambulatory Visit: Payer: Self-pay | Admitting: Family Medicine

## 2015-07-21 ENCOUNTER — Ambulatory Visit
Admission: RE | Admit: 2015-07-21 | Discharge: 2015-07-21 | Disposition: A | Discharge status: Home / Self Care | Payer: 59 | Source: Ambulatory Visit

## 2015-07-21 DIAGNOSIS — Z1231 Encounter for screening mammogram for malignant neoplasm of breast: Secondary | ICD-10-CM

## 2015-12-08 ENCOUNTER — Other Ambulatory Visit: Payer: Self-pay | Admitting: Gastroenterology

## 2016-02-29 ENCOUNTER — Other Ambulatory Visit (HOSPITAL_COMMUNITY): Payer: Self-pay | Admitting: Surgery

## 2016-03-14 ENCOUNTER — Ambulatory Visit (HOSPITAL_COMMUNITY)
Admission: RE | Admit: 2016-03-14 | Discharge: 2016-03-14 | Disposition: A | Discharge status: Home / Self Care | Payer: 59 | Source: Ambulatory Visit | Attending: Surgery | Admitting: Surgery

## 2016-03-14 ENCOUNTER — Encounter (HOSPITAL_COMMUNITY): Payer: Self-pay | Admitting: Radiology

## 2016-03-14 DIAGNOSIS — K219 Gastro-esophageal reflux disease without esophagitis: Secondary | ICD-10-CM | POA: Insufficient documentation

## 2016-03-14 DIAGNOSIS — K449 Diaphragmatic hernia without obstruction or gangrene: Secondary | ICD-10-CM | POA: Diagnosis not present

## 2016-03-28 ENCOUNTER — Encounter: Payer: 59 | Attending: Surgery | Admitting: Dietician

## 2016-03-28 DIAGNOSIS — Z6841 Body Mass Index (BMI) 40.0 and over, adult: Secondary | ICD-10-CM | POA: Diagnosis not present

## 2016-03-28 DIAGNOSIS — E669 Obesity, unspecified: Secondary | ICD-10-CM

## 2016-03-28 DIAGNOSIS — Z713 Dietary counseling and surveillance: Secondary | ICD-10-CM | POA: Diagnosis not present

## 2016-03-28 NOTE — Patient Instructions (Signed)
Follow Pre-Op Goals Try Protein Shakes Call [facility] at [phone] when surgery is scheduled to enroll in Pre-Op Class  Things to remember:  Please always be honest with us. We want to support you!  If you have any questions or concerns in between appointments, please call or email Liz, Leslie, or Laurie.  The diet after surgery will be high protein and low in carbohydrate.  Vitamins and calcium need to be taken for the rest of your life.  Feel free to include support people in any classes or appointments.   Supplement recommendations:  Before Surgery   1 Complete Multivitamin with Iron  3000 IU Vitamin D3  After Surgery   2 Chewable Multivitamins  **Best Choice - Bariatric Advantage Advanced Multi EA      3 Chewable Calcium (500 mg each, total 1200-1500 mg per day)  **Best Choice - Celebrate, Bariatric Advantage, or Wellesse  Other Options:    2 Flinstones Complete + up to 100 mg Thiamin + 2000-3000 IU Vitamin D3 + 350-500 mcg Vitamin B12 + 30-45 mg Iron (with history of deficiency)  2 Celebrate MultiComplete with 18 mg Iron (this provides 6000 IU of  Vitamin D3)  4 Celebrate Essential Multi 2 in 1 (has calcium) + 18-60 mg separate  iron  Vitamins and Calcium are available at:   [facility]   [address], [place], [place] [address]   [email or web address]  [email or web address]  [email or web address]   

## 2016-03-28 NOTE — Progress Notes (Signed)
  Pre-Op Assessment Visit:  Pre-Operative sleeve gastrectomy Surgery  Medical Nutrition Therapy:  Appt start time: [id number]   End time:  [id number].  Patient was seen on 03/28/2016 for Pre-Operative Nutrition Assessment. Assessment and letter of approval faxed to [facility] Bariatric Surgery Program coordinator on 03/28/2016.   Preferred Learning Style:   No preference indicated   Learning Readiness:   Ready  Handouts given during visit include:  Pre-Op Goals Bariatric Surgery Protein Shakes   During the appointment today the following Pre-Op Goals were reviewed with the patient: Maintain or lose weight as instructed by your surgeon Make healthy food choices Begin to limit portion sizes Limited concentrated sugars and fried foods Keep fat/sugar in the single digits per serving on   food labels Practice CHEWING your food  (aim for 30 chews per bite or until applesauce consistency) Practice not drinking 15 minutes before, during, and 30 minutes after each meal/snack Avoid all carbonated beverages  Avoid/limit caffeinated beverages  Avoid all sugar-sweetened beverages Consume 3 meals per day; eat every 3-5 hours Make a list of non-food related activities Aim for 64-100 ounces of FLUID daily  Aim for at least 60-80 grams of PROTEIN daily Look for a liquid protein source that contain ?15 g protein and ?5 g carbohydrate  (ex: shakes, drinks, shots)  Patient-Centered Goals: Would like to have more energy and be able to more and feel good.   Demonstrated degree of understanding via:  Teach Back  Teaching Method Utilized:  Visual Auditory Hands on  Barriers to learning/adherence to lifestyle change: none  [facility] to [facility] the [facility] to enroll in Pre-Op and Post-Op Nutrition Education when surgery date is scheduled.

## 2016-04-29 ENCOUNTER — Encounter: Payer: 59 | Attending: Surgery | Admitting: Dietician

## 2016-04-29 DIAGNOSIS — Z6841 Body Mass Index (BMI) 40.0 and over, adult: Secondary | ICD-10-CM | POA: Diagnosis not present

## 2016-04-29 DIAGNOSIS — Z713 Dietary counseling and surveillance: Secondary | ICD-10-CM | POA: Insufficient documentation

## 2016-04-30 ENCOUNTER — Encounter: Payer: Self-pay | Admitting: Dietician

## 2016-04-30 NOTE — Progress Notes (Signed)
  Pre-Operative Nutrition Class:  Appt start time: 2637   End time:  1830.  Patient was seen on 04/29/16 for Pre-Operative Bariatric Surgery Education at the [facility].   Surgery date:  Surgery type: Sleeve gastrectomy Start weight at [facility]: 270 lbs on 03/28/2016 Weight today: 270 lbs  TANITA  BODY COMP RESULTS  04/29/16   BMI (kg/m^2) 45.6   Fat Mass (lbs) 146.8   Fat Free Mass (lbs) 123.2   Total Body Water (lbs) 90.8   Samples given per MNT protocol. Patient educated on appropriate usage: Bariatric Advantage Multivitamin (mixed fruit - qty 1) Lot #: [id number] Exp: 04/2017  Celebrate Vitamins Calcium Citrate chew (berry - qty 1) Lot #: [id number] Exp: 08/2017  Renee Pain Protein Powder (unflavored - qty 1) Lot #: [id number] Exp: 08/2017  Premier protein shake (vanilla - qty 1) Lot#: [id number] Exp: 01/2017   The following the learning objectives were met by the patient during this course:  Identify Pre-Op Dietary Goals and will begin 2 weeks pre-operatively  Identify appropriate sources of fluids and proteins   State protein recommendations and appropriate sources pre and post-operatively  Identify Post-Operative Dietary Goals and will follow for 2 weeks post-operatively  Identify appropriate multivitamin and calcium sources  Describe the need for physical activity post-operatively and will follow MD recommendations  State when to call healthcare provider regarding medication questions or post-operative complications  Handouts given during class include:  Pre-Op Bariatric Surgery Diet Handout  Protein Shake Handout  Post-Op Bariatric Surgery Nutrition Handout  [facility] Program Information Flyer  Support Group Information Flyer  [facility] Bariatric Supplements Price List  Follow-Up Plan: Patient will follow-up at [facility] 2 weeks post operatively for diet advancement per MD.

## 2016-05-07 ENCOUNTER — Ambulatory Visit: Payer: Self-pay | Admitting: Surgery

## 2016-05-09 NOTE — Patient Instructions (Signed)
Stephanie [name]  05/09/2016   Your procedure is scheduled on: Tuesday 05/14/2016  Report to [facility]  Entrance take [facility]  elevators to 3rd floor to  [place] at  1045 AM.  Call this number if you have problems the morning of surgery [phone]   Remember: ONLY 1 PERSON MAY GO WITH YOU TO SHORT STAY TO GET  READY MORNING OF [place].   Do not eat food or drink liquids :After Midnight.     Take these medicines the morning of surgery with A SIP OF WATER: Amlodipine, Synthroid                                You may not have any metal on your body including hair pins and              piercings  Do not wear jewelry, make-up, lotions, powders or perfumes, deodorant             Do not wear nail polish.  Do not shave  48 hours prior to surgery.              Men may shave face and neck.   Do not bring valuables to the hospital. [place].  Contacts, dentures or bridgework may not be worn into surgery.  Leave suitcase in the car. After surgery it may be brought to your room.                  Please read over the following fact sheets you were given: _____________________________________________________________________             [facility] - Preparing for Surgery Before surgery, you can play an important role.  Because skin is not sterile, your skin needs to be as free of germs as possible.  You can reduce the number of germs on your skin by washing with CHG (chlorahexidine gluconate) soap before surgery.  CHG is an antiseptic cleaner which kills germs and bonds with the skin to continue killing germs even after washing. Please DO NOT use if you have an allergy to CHG or antibacterial soaps.  If your skin becomes reddened/irritated stop using the CHG and inform your nurse when you arrive at Short Stay. Do not shave (including legs and underarms) for at least 48 hours prior to the first CHG  shower.  You may shave your face/neck. Please follow these instructions carefully:  1.  Shower with CHG Soap the night before surgery and the  morning of Surgery.  2.  If you choose to wash your hair, wash your hair first as usual with your  normal  shampoo.  3.  After you shampoo, rinse your hair and body thoroughly to remove the  shampoo.                           4.  Use CHG as you would any other liquid soap.  You can apply chg directly  to the skin and wash                       Gently with a scrungie or clean washcloth.  5.  Apply the CHG Soap to your body ONLY FROM THE NECK DOWN.   Do not use on face/ open                           Wound or open sores. Avoid contact with eyes, ears mouth and genitals (private parts).                       Wash face,  Genitals (private parts) with your normal soap.             6.  Wash thoroughly, paying special attention to the area where your surgery  will be performed.  7.  Thoroughly rinse your body with warm water from the neck down.  8.  DO NOT shower/wash with your normal soap after using and rinsing off  the CHG Soap.                9.  Pat yourself dry with a clean towel.            10.  Wear clean pajamas.            11.  Place clean sheets on your bed the night of your first shower and do not  sleep with pets. Day of Surgery : Do not apply any lotions/deodorants the morning of surgery.  Please wear clean clothes to the hospital/surgery center.  FAILURE TO FOLLOW THESE INSTRUCTIONS MAY RESULT IN THE CANCELLATION OF YOUR SURGERY PATIENT SIGNATURE_________________________________  NURSE SIGNATURE__________________________________  ________________________________________________________________________

## 2016-05-13 ENCOUNTER — Encounter (HOSPITAL_COMMUNITY)
Admission: RE | Admit: 2016-05-13 | Discharge: 2016-05-13 | Disposition: A | Discharge status: Home / Self Care | Payer: 59 | Source: Ambulatory Visit | Attending: Surgery | Admitting: Surgery

## 2016-05-13 ENCOUNTER — Encounter (HOSPITAL_COMMUNITY): Payer: Self-pay

## 2016-05-13 DIAGNOSIS — Z6841 Body Mass Index (BMI) 40.0 and over, adult: Secondary | ICD-10-CM

## 2016-05-13 DIAGNOSIS — Z01812 Encounter for preprocedural laboratory examination: Secondary | ICD-10-CM | POA: Insufficient documentation

## 2016-05-13 DIAGNOSIS — E669 Obesity, unspecified: Secondary | ICD-10-CM

## 2016-05-13 HISTORY — DX: Headache, unspecified: R51.9

## 2016-05-13 HISTORY — DX: Hypothyroidism, unspecified: E03.9

## 2016-05-13 HISTORY — DX: Gastro-esophageal reflux disease without esophagitis: K21.9

## 2016-05-13 HISTORY — DX: Headache: R51

## 2016-05-13 LAB — COMPREHENSIVE METABOLIC PANEL
ALT: 51 U/L (ref 14–54)
AST: 32 U/L (ref 15–41)
Albumin: 4.3 g/dL (ref 3.5–5.0)
Alkaline Phosphatase: 55 U/L (ref 38–126)
Anion gap: 7 (ref 5–15)
BUN: 17 mg/dL (ref 6–20)
CO2: 25 mmol/L (ref 22–32)
Calcium: 9 mg/dL (ref 8.9–10.3)
Chloride: 106 mmol/L (ref 101–111)
Creatinine, Ser: 0.64 mg/dL (ref 0.44–1.00)
GFR calc Af Amer: >60 mL/min (ref >60)
GFR calc non Af Amer: >60 mL/min (ref >60)
Glucose, Bld: 102 mg/dL — ABNORMAL HIGH (ref 65–99)
Potassium: 3.7 mmol/L (ref 3.5–5.1)
Sodium: 138 mmol/L (ref 135–145)
Total Bilirubin: 0.8 mg/dL (ref 0.3–1.2)
Total Protein: 6.9 g/dL (ref 6.5–8.1)

## 2016-05-13 LAB — CBC WITH DIFFERENTIAL/PLATELET
Basophils Absolute: 0 10*3/uL (ref 0.0–0.1)
Basophils Relative: 0 %
Eosinophils Absolute: 0.1 10*3/uL (ref 0.0–0.7)
Eosinophils Relative: 1 %
HCT: 39.9 % (ref 36.0–46.0)
Hemoglobin: 13.6 g/dL (ref 12.0–15.0)
Lymphocytes Relative: 31 %
Lymphs Abs: 3.2 10*3/uL (ref 0.7–4.0)
MCH: 30.7 pg (ref 26.0–34.0)
MCHC: 34.1 g/dL (ref 30.0–36.0)
MCV: 90.1 fL (ref 78.0–100.0)
Monocytes Absolute: 0.6 10*3/uL (ref 0.1–1.0)
Monocytes Relative: 6 %
Neutro Abs: 6.3 10*3/uL (ref 1.7–7.7)
Neutrophils Relative %: 62 %
Platelets: 327 10*3/uL (ref 150–400)
RBC: 4.43 MIL/uL (ref 3.87–5.11)
RDW: 13 % (ref 11.5–15.5)
WBC: 10.2 10*3/uL (ref 4.0–10.5)

## 2016-05-13 NOTE — Progress Notes (Signed)
02/29/2016- EKG on chart from Dr. Emi Belfast. 03/14/2016- noted in [facility]

## 2016-05-13 NOTE — H&P (Signed)
Stephanie [name] [place] Location: [facility] Patient #: [id number] DOB: 04-17-1961 Single / Language: Stephanie [name] / Race: White Female   History of Present Illness  The patient is a 55 year old female who presents for a bariatric surgery evaluation. She has been thinking about this for several years and has done her homework. She has had obesity all of her life and has tried many different diets and drugs with the most success with Topamax but she got pneumonia with this. She has no GERD. She has mild nonalcoholic steatohepatitis, hypertension and POS. Her BMI is 45 and her weight is 268. She is 5'4".   We discussed the sleeve gastrectomy in some detail. She has a strong hunger drive and hopes that this will quell this. Topamax reduced her desire to eat and she felt "normal".   I discussed the procedure in detail including the complications not limited to leaks and bleeding. She has no hx of DVT and no GERD. She has worked for [employer] for 30 years.    Other Problems  High blood pressure  Thyroid Disease  Vascular Disease   Past Surgical History  Breast Reconstruction  Bilateral. Cesarean Section - 1  Colon Polyp Removal - Colonoscopy  Gallbladder Surgery - Laparoscopic  Oral Surgery   Diagnostic Studies History  Colonoscopy  within last year Mammogram  within last year Pap Smear  1-5 years ago  Allergies  Morphine Sulfate (Concentrate) *ANALGESICS - OPIOID*   Medication History ([name], CMA; 02/28/2016 2:27 PM) Benazepril HCl (20MG  Tablet, Oral) Active. AmLODIPine Besylate (10MG  Tablet, Oral) Active. Synthroid (75MCG Tablet, Oral) Active. Medications Reconciled  Social History [name], CMA; 02/28/2016 2:25 PM) Alcohol use  Occasional alcohol use. Caffeine use  Carbonated beverages, Tea. No drug use  Tobacco use  Never smoker.  Family History Arthritis  Mother. Cancer  Mother. Cerebrovascular Accident  Mother. Colon  Polyps  Father. Heart disease in female family member before age 36  Heart disease in female family member before age 64  Hypertension  Father.  Pregnancy / Birth History  Age at menarche  57 years. Contraceptive History  Oral contraceptives. Gravida  1 Irregular periods  Maternal age  59-40 Para  1    Review of Systems  General Not Present- Appetite Loss, Chills, Fatigue, Fever, Night Sweats, Weight Gain and Weight Loss. Skin Not Present- Change in Wart/Mole, Dryness, Hives, Jaundice, New Lesions, Non-Healing Wounds, Rash and Ulcer. HEENT Present- Seasonal Allergies and Wears glasses/contact lenses. Not Present- Earache, Hearing Loss, Hoarseness, Nose Bleed, Oral Ulcers, Ringing in the Ears, Sinus Pain, Sore Throat, Visual Disturbances and Yellow Eyes. Respiratory Not Present- Bloody sputum, Chronic Cough, Difficulty Breathing, Snoring and Wheezing. Breast Not Present- Breast Mass, Breast Pain, Nipple Discharge and Skin Changes. Cardiovascular Not Present- Chest Pain, Difficulty Breathing Lying Down, Leg Cramps, Palpitations, Rapid Heart Rate, Shortness of Breath and Swelling of Extremities. Gastrointestinal Not Present- Abdominal Pain, Bloating, Bloody Stool, Change in Bowel Habits, Chronic diarrhea, Constipation, Difficulty Swallowing, Excessive gas, Gets full quickly at meals, Hemorrhoids, Indigestion, Nausea, Rectal Pain and Vomiting. Female Genitourinary Not Present- Frequency, Nocturia, Painful Urination, Pelvic Pain and Urgency. Neurological Not Present- Decreased Memory, Fainting, Headaches, Numbness, Seizures, Tingling, Tremor, Trouble walking and Weakness. Psychiatric Not Present- Anxiety, Bipolar, Change in Sleep Pattern, Depression, Fearful and Frequent crying. Endocrine Not Present- Cold Intolerance, Excessive Hunger, Hair Changes, Heat Intolerance, Hot flashes and New Diabetes. Hematology Not Present- Blood Thinners, Easy Bruising, Excessive bleeding, Gland problems,  HIV and Persistent Infections.  Vitals  02/28/2016 2:26 PM Weight: 268 lb Height: 64.5in Body Surface Area: 2.23 m Body Mass Index: 45.29 kg/m  Temp.: 109F(Temporal)  Pulse: 82 (Regular)  BP: 132/78 (Sitting, Left Arm, Standard)       Physical Exam  General Note: HEENT unremarkable Neck supple Chest clear Heart SR without murmurs Breast-reduction (no exam) Abdomen nontender-lower midline scar from myomectomy and c section. Ext FROM without history of DVT     Assessment & Plan Rodman Key B. Hassell Done MD; 02/28/2016 3:18 PM) MORBID OBESITY, UNSPECIFIED OBESITY TYPE (E66.01)  For Gastric Sleeve Resection  [name], MD,FACS

## 2016-05-14 ENCOUNTER — Inpatient Hospital Stay (HOSPITAL_COMMUNITY)
Admission: RE | Admit: 2016-05-14 | Discharge: 2016-05-15 | DRG: 621 | Disposition: A | Discharge status: Home / Self Care | Payer: 59 | Source: Ambulatory Visit | Attending: Surgery | Admitting: Surgery

## 2016-05-14 ENCOUNTER — Encounter (HOSPITAL_COMMUNITY): Payer: Self-pay | Admitting: *Deleted

## 2016-05-14 ENCOUNTER — Encounter (HOSPITAL_COMMUNITY)
Admission: RE | Disposition: A | Discharge status: Home / Self Care | Payer: Self-pay | Source: Ambulatory Visit | Attending: Surgery

## 2016-05-14 ENCOUNTER — Inpatient Hospital Stay (HOSPITAL_COMMUNITY): Payer: 59 | Admitting: Registered Nurse

## 2016-05-14 DIAGNOSIS — E039 Hypothyroidism, unspecified: Secondary | ICD-10-CM | POA: Diagnosis present

## 2016-05-14 DIAGNOSIS — K449 Diaphragmatic hernia without obstruction or gangrene: Secondary | ICD-10-CM | POA: Diagnosis present

## 2016-05-14 DIAGNOSIS — Z9884 Bariatric surgery status: Secondary | ICD-10-CM

## 2016-05-14 DIAGNOSIS — Z01812 Encounter for preprocedural laboratory examination: Secondary | ICD-10-CM | POA: Diagnosis not present

## 2016-05-14 DIAGNOSIS — Z6841 Body Mass Index (BMI) 40.0 and over, adult: Secondary | ICD-10-CM

## 2016-05-14 DIAGNOSIS — I10 Essential (primary) hypertension: Secondary | ICD-10-CM | POA: Diagnosis present

## 2016-05-14 HISTORY — DX: Bariatric surgery status: Z98.84

## 2016-05-14 HISTORY — PX: LAPAROSCOPIC GASTRIC SLEEVE RESECTION WITH HIATAL HERNIA REPAIR: SHX6512

## 2016-05-14 LAB — CREATININE, SERUM
Creatinine, Ser: 0.69 mg/dL (ref 0.44–1.00)
GFR calc Af Amer: >60 mL/min (ref >60)
GFR calc non Af Amer: >60 mL/min (ref >60)

## 2016-05-14 LAB — HEMOGLOBIN AND HEMATOCRIT, BLOOD
HCT: 40 % (ref 36.0–46.0)
Hemoglobin: 13.4 g/dL (ref 12.0–15.0)

## 2016-05-14 LAB — PREGNANCY, URINE: Preg Test, Ur: NEGATIVE

## 2016-05-14 SURGERY — GASTRECTOMY, SLEEVE, LAPAROSCOPIC, WITH HIATAL HERNIA REPAIR
Anesthesia: General | Site: Abdomen

## 2016-05-14 MED ORDER — MORPHINE SULFATE (PF) 10 MG/ML IV SOLN
2.0000 mg | INTRAVENOUS | Status: DC | PRN
  Administered 2016-05-14: 2 mg via INTRAVENOUS
  Filled 2016-05-14: qty 1

## 2016-05-14 MED ORDER — PROMETHAZINE HCL 25 MG/ML IJ SOLN
INTRAMUSCULAR | Status: AC
  Filled 2016-05-14: qty 1

## 2016-05-14 MED ORDER — MIDAZOLAM HCL 2 MG/2ML IJ SOLN: 0.5000 mg | Freq: Once | INTRAMUSCULAR | Status: DC | PRN

## 2016-05-14 MED ORDER — KCL IN DEXTROSE-NACL 20-5-0.45 MEQ/L-%-% IV SOLN
INTRAVENOUS | Status: DC
  Administered 2016-05-14 – 2016-05-15 (×2): via INTRAVENOUS
  Filled 2016-05-14 (×3): qty 1000

## 2016-05-14 MED ORDER — LIDOCAINE 2% (20 MG/ML) 5 ML SYRINGE
INTRAMUSCULAR | Status: DC | PRN
  Administered 2016-05-14: 40 mg via INTRAVENOUS

## 2016-05-14 MED ORDER — PROPOFOL 10 MG/ML IV BOLUS
INTRAVENOUS | Status: DC | PRN
Start: 2016-05-14 — End: 2016-05-14
  Administered 2016-05-14: 150 mg via INTRAVENOUS

## 2016-05-14 MED ORDER — CEFOTETAN DISODIUM-DEXTROSE 2-2.08 GM-% IV SOLR
INTRAVENOUS | Status: AC
  Filled 2016-05-14: qty 50

## 2016-05-14 MED ORDER — FENTANYL CITRATE (PF) 100 MCG/2ML IJ SOLN
INTRAMUSCULAR | Status: AC
  Filled 2016-05-14: qty 4

## 2016-05-14 MED ORDER — MIDAZOLAM HCL 5 MG/5ML IJ SOLN
INTRAMUSCULAR | Status: DC | PRN
  Administered 2016-05-14: 2 mg via INTRAVENOUS

## 2016-05-14 MED ORDER — CEFOTETAN DISODIUM-DEXTROSE 2-2.08 GM-% IV SOLR
2.0000 g | INTRAVENOUS | Status: AC
  Administered 2016-05-14: 2 g via INTRAVENOUS

## 2016-05-14 MED ORDER — SUGAMMADEX SODIUM 500 MG/5ML IV SOLN
INTRAVENOUS | Status: AC
  Filled 2016-05-14: qty 5

## 2016-05-14 MED ORDER — ROCURONIUM BROMIDE 10 MG/ML (PF) SYRINGE
PREFILLED_SYRINGE | INTRAVENOUS | Status: DC | PRN
  Administered 2016-05-14: 50 mg via INTRAVENOUS
  Administered 2016-05-14: 20 mg via INTRAVENOUS

## 2016-05-14 MED ORDER — CHLORHEXIDINE GLUCONATE CLOTH 2 % EX PADS: 6.0000 | MEDICATED_PAD | Freq: Once | CUTANEOUS | Status: DC

## 2016-05-14 MED ORDER — HYDROMORPHONE HCL 1 MG/ML IJ SOLN
INTRAMUSCULAR | Status: AC
  Filled 2016-05-14: qty 1

## 2016-05-14 MED ORDER — SODIUM CHLORIDE 0.9 % IJ SOLN
INTRAMUSCULAR | Status: AC
  Filled 2016-05-14: qty 10

## 2016-05-14 MED ORDER — DEXAMETHASONE SODIUM PHOSPHATE 10 MG/ML IJ SOLN
INTRAMUSCULAR | Status: DC | PRN
  Administered 2016-05-14: 10 mg via INTRAVENOUS

## 2016-05-14 MED ORDER — ACETAMINOPHEN 160 MG/5ML PO SOLN
650.0000 mg | ORAL | Status: DC | PRN
  Administered 2016-05-15: 650 mg via ORAL
  Filled 2016-05-14: qty 20.3

## 2016-05-14 MED ORDER — MEPERIDINE HCL 50 MG/ML IJ SOLN: 6.2500 mg | INTRAMUSCULAR | Status: DC | PRN

## 2016-05-14 MED ORDER — BUPIVACAINE LIPOSOME 1.3 % IJ SUSP
20.0000 mL | Freq: Once | INTRAMUSCULAR | Status: AC
  Administered 2016-05-14: 20 mL
  Filled 2016-05-14: qty 20

## 2016-05-14 MED ORDER — ROCURONIUM BROMIDE 50 MG/5ML IV SOSY
PREFILLED_SYRINGE | INTRAVENOUS | Status: AC
  Filled 2016-05-14: qty 10

## 2016-05-14 MED ORDER — ONDANSETRON HCL 4 MG/2ML IJ SOLN
INTRAMUSCULAR | Status: AC
  Filled 2016-05-14: qty 2

## 2016-05-14 MED ORDER — 0.9 % SODIUM CHLORIDE (POUR BTL) OPTIME
TOPICAL | Status: DC | PRN
  Administered 2016-05-14: 1000 mL

## 2016-05-14 MED ORDER — HEPARIN SODIUM (PORCINE) 5000 UNIT/ML IJ SOLN
5000.0000 [IU] | Freq: Three times a day (TID) | INTRAMUSCULAR | Status: DC
  Administered 2016-05-14 – 2016-05-15 (×2): 5000 [IU] via SUBCUTANEOUS
  Filled 2016-05-14 (×2): qty 1

## 2016-05-14 MED ORDER — MIDAZOLAM HCL 2 MG/2ML IJ SOLN
INTRAMUSCULAR | Status: AC
  Filled 2016-05-14: qty 2

## 2016-05-14 MED ORDER — LACTATED RINGERS IR SOLN
Status: DC | PRN
  Administered 2016-05-14: 3000 mL

## 2016-05-14 MED ORDER — ONDANSETRON HCL 4 MG/2ML IJ SOLN
INTRAMUSCULAR | Status: DC | PRN
  Administered 2016-05-14: 4 mg via INTRAVENOUS

## 2016-05-14 MED ORDER — LIDOCAINE 2% (20 MG/ML) 5 ML SYRINGE
INTRAMUSCULAR | Status: AC
  Filled 2016-05-14: qty 5

## 2016-05-14 MED ORDER — SUGAMMADEX SODIUM 500 MG/5ML IV SOLN
INTRAVENOUS | Status: DC | PRN
  Administered 2016-05-14: 300 mg via INTRAVENOUS

## 2016-05-14 MED ORDER — ONDANSETRON HCL 4 MG/2ML IJ SOLN: 4.0000 mg | INTRAMUSCULAR | Status: DC | PRN

## 2016-05-14 MED ORDER — FENTANYL CITRATE (PF) 100 MCG/2ML IJ SOLN
INTRAMUSCULAR | Status: DC | PRN
  Administered 2016-05-14: 25 ug via INTRAVENOUS
  Administered 2016-05-14: 50 ug via INTRAVENOUS
  Administered 2016-05-14: 25 ug via INTRAVENOUS
  Administered 2016-05-14: 100 ug via INTRAVENOUS

## 2016-05-14 MED ORDER — PREMIER PROTEIN SHAKE: 2.0000 [oz_av] | ORAL | Status: DC

## 2016-05-14 MED ORDER — HEPARIN SODIUM (PORCINE) 5000 UNIT/ML IJ SOLN
5000.0000 [IU] | INTRAMUSCULAR | Status: AC
  Administered 2016-05-14: 5000 [IU] via SUBCUTANEOUS
  Filled 2016-05-14: qty 1

## 2016-05-14 MED ORDER — OXYCODONE HCL 5 MG/5ML PO SOLN
5.0000 mg | ORAL | Status: DC | PRN
  Administered 2016-05-15 (×3): 10 mg via ORAL
  Filled 2016-05-14 (×3): qty 10

## 2016-05-14 MED ORDER — ACETAMINOPHEN 160 MG/5ML PO SOLN: 325.0000 mg | ORAL | Status: DC | PRN

## 2016-05-14 MED ORDER — LACTATED RINGERS IV SOLN
INTRAVENOUS | Status: DC
  Administered 2016-05-14 (×2): via INTRAVENOUS

## 2016-05-14 MED ORDER — PANTOPRAZOLE SODIUM 40 MG IV SOLR
40.0000 mg | Freq: Every day | INTRAVENOUS | Status: DC
  Administered 2016-05-14: 40 mg via INTRAVENOUS
  Filled 2016-05-14: qty 40

## 2016-05-14 MED ORDER — APREPITANT 80 MG PO CAPS
80.0000 mg | ORAL_CAPSULE | ORAL | Status: AC
  Administered 2016-05-14: 80 mg via ORAL
  Filled 2016-05-14: qty 1

## 2016-05-14 MED ORDER — SODIUM CHLORIDE 0.9 % IJ SOLN
INTRAMUSCULAR | Status: DC | PRN
  Administered 2016-05-14: 10 mL

## 2016-05-14 MED ORDER — DEXAMETHASONE SODIUM PHOSPHATE 10 MG/ML IJ SOLN
INTRAMUSCULAR | Status: AC
  Filled 2016-05-14: qty 1

## 2016-05-14 MED ORDER — HYDROMORPHONE HCL 1 MG/ML IJ SOLN
0.2500 mg | INTRAMUSCULAR | Status: DC | PRN
  Administered 2016-05-14 (×4): 0.5 mg via INTRAVENOUS

## 2016-05-14 MED ORDER — PROMETHAZINE HCL 25 MG/ML IJ SOLN
6.2500 mg | INTRAMUSCULAR | Status: AC | PRN
  Administered 2016-05-14 (×2): 12.5 mg via INTRAVENOUS

## 2016-05-14 SURGICAL SUPPLY — 65 items
ADH SKN CLS APL DERMABOND .7 (GAUZE/BANDAGES/DRESSINGS) ×1
APL SRG 32X5 SNPLK LF DISP (MISCELLANEOUS)
APPLICATOR COTTON TIP 6IN STRL (MISCELLANEOUS) IMPLANT
APPLIER CLIP ROT 10 11.4 M/L (STAPLE)
APPLIER CLIP ROT 13.4 12 LRG (CLIP)
APR CLP LRG 13.4X12 ROT 20 MLT (CLIP)
APR CLP MED LRG 11.4X10 (STAPLE)
BAG SPEC RTRVL LRG 6X4 10 (ENDOMECHANICALS)
BLADE SURG 15 STRL LF DISP TIS (BLADE) ×1 IMPLANT
BLADE SURG 15 STRL SS (BLADE) ×2
CABLE HIGH FREQUENCY MONO STRZ (ELECTRODE) IMPLANT
CLIP APPLIE ROT 10 11.4 M/L (STAPLE) IMPLANT
CLIP APPLIE ROT 13.4 12 LRG (CLIP) IMPLANT
COVER SURGICAL LIGHT HANDLE (MISCELLANEOUS) ×2 IMPLANT
DERMABOND ADVANCED (GAUZE/BANDAGES/DRESSINGS) ×1
DERMABOND ADVANCED .7 DNX12 (GAUZE/BANDAGES/DRESSINGS) ×1 IMPLANT
DEVICE SUT QUICK LOAD TK 5 (STAPLE) ×1 IMPLANT
DEVICE SUT TI-KNOT TK 5X26 (MISCELLANEOUS) ×1 IMPLANT
DEVICE SUTURE ENDOST 10MM (ENDOMECHANICALS) ×1 IMPLANT
DEVICE TROCAR PUNCTURE CLOSURE (ENDOMECHANICALS) ×2 IMPLANT
DISSECTOR BLUNT TIP ENDO 5MM (MISCELLANEOUS) ×2 IMPLANT
ELECT REM PT RETURN 9FT ADLT (ELECTROSURGICAL) ×2
ELECTRODE REM PT RTRN 9FT ADLT (ELECTROSURGICAL) ×1 IMPLANT
GAUZE SPONGE 4X4 12PLY STRL (GAUZE/BANDAGES/DRESSINGS) IMPLANT
GLOVE BIOGEL M 8.0 STRL (GLOVE) ×2 IMPLANT
GOWN STRL REUS W/TWL XL LVL3 (GOWN DISPOSABLE) ×6 IMPLANT
HANDLE STAPLE EGIA 4 XL (STAPLE) ×2 IMPLANT
HOVERMATT SINGLE USE (MISCELLANEOUS) ×2 IMPLANT
IRRIG SUCT STRYKERFLOW 2 WTIP (MISCELLANEOUS) ×2
IRRIGATION SUCT STRKRFLW 2 WTP (MISCELLANEOUS) ×1 IMPLANT
KIT BASIN OR (CUSTOM PROCEDURE TRAY) ×2 IMPLANT
MARKER SKIN DUAL TIP RULER LAB (MISCELLANEOUS) ×2 IMPLANT
NEEDLE SPNL 22GX3.5 QUINCKE BK (NEEDLE) ×2 IMPLANT
PACK UNIVERSAL I (CUSTOM PROCEDURE TRAY) ×2 IMPLANT
POUCH SPECIMEN RETRIEVAL 10MM (ENDOMECHANICALS) IMPLANT
RELOAD ENDO STITCH (ENDOMECHANICALS) ×2 IMPLANT
RELOAD TRI 45 ART MED THCK BLK (STAPLE) ×2 IMPLANT
RELOAD TRI 45 ART MED THCK PUR (STAPLE) IMPLANT
RELOAD TRI 60 ART MED THCK BLK (STAPLE) ×2 IMPLANT
RELOAD TRI 60 ART MED THCK PUR (STAPLE) ×2 IMPLANT
SCISSORS LAP 5X45 EPIX DISP (ENDOMECHANICALS) IMPLANT
SEALANT SURGICAL APPL DUAL CAN (MISCELLANEOUS) IMPLANT
SHEARS HARMONIC ACE PLUS 45CM (MISCELLANEOUS) ×2 IMPLANT
SLEEVE ADV FIXATION 5X100MM (TROCAR) ×4 IMPLANT
SLEEVE GASTRECTOMY 36FR VISIGI (MISCELLANEOUS) ×2 IMPLANT
SOLUTION ANTI FOG 6CC (MISCELLANEOUS) ×2 IMPLANT
SPONGE LAP 18X18 X RAY DECT (DISPOSABLE) ×2 IMPLANT
STAPLER VISISTAT 35W (STAPLE) IMPLANT
SUT VIC AB 4-0 SH 18 (SUTURE) ×2 IMPLANT
SUT VICRYL 0 TIES 12 18 (SUTURE) ×2 IMPLANT
SYR 10ML ECCENTRIC (SYRINGE) ×2 IMPLANT
SYR 20CC LL (SYRINGE) ×2 IMPLANT
SYR 50ML LL SCALE MARK (SYRINGE) ×2 IMPLANT
SYSTEM WECK SHIELD CLOSURE (TROCAR) IMPLANT
TOWEL OR 17X26 10 PK STRL BLUE (TOWEL DISPOSABLE) ×2 IMPLANT
TOWEL OR NON WOVEN STRL DISP B (DISPOSABLE) ×2 IMPLANT
TRAY FOLEY W/METER SILVER 16FR (SET/KITS/TRAYS/PACK) IMPLANT
TROCAR ADV FIXATION 12X100MM (TROCAR) ×2 IMPLANT
TROCAR ADV FIXATION 5X100MM (TROCAR) ×2 IMPLANT
TROCAR BLADELESS 15MM (ENDOMECHANICALS) ×2 IMPLANT
TROCAR BLADELESS OPT 5 100 (ENDOMECHANICALS) ×2 IMPLANT
TUBE CALIBRATION LAPBAND (TUBING) ×1 IMPLANT
TUBING CONNECTING 10 (TUBING) ×2 IMPLANT
TUBING ENDO SMARTCAP (MISCELLANEOUS) ×2 IMPLANT
TUBING INSUF HEATED (TUBING) ×2 IMPLANT

## 2016-05-14 NOTE — Op Note (Signed)
Name:  Stephanie [name] MRN: [id number] Date of Surgery: 05/14/2016  Preop Diagnosis:  Morbid Obesity  Postop Diagnosis:  Morbid Obesity (Weight - 268, BMI - 45.3), S/P Gastric Sleeve  Procedure:  Upper endoscopy  (Intraoperative)  Surgeon:  Alphonsa Overall, M.D.  Anesthesia:  GET  Indications for procedure: Stephanie [name] is a 55 y.o. female whose primary care physician is Kelton Pillar, MD and has completed a Gastric Sleeve today by Dr. Hassell Done.  I am doing an intraoperative upper endoscopy to evaluate the gastric pouch.  Operative Note: The patient is under general anesthesia.  Dr. Hassell Done is laparoscoping the patient while I do an upper endoscopy to evaluate the stomach pouch.  With the patient intubated, I passed the Olympus upper endoscope without difficulty down the esophagus.  The esophagus was unremarkable.  The esophago-gastric junction was at 40 cm.    The mucosa of the stomach looked viable and the staple line was intact without bleeding.  I advanced the scope to the pylorus, but did not go through it.  While I insufflated the stomach pouch with air, Dr. Hassell Done  flooded the upper abdomen with saline to put the gastric pouch under saline.  There was no bubbling or evidence of a leak.  There was no evidence of narrowing of the pouch and the gastric sleeve looked tubular.  The scope was then withdrawn.  The esophagus was unremarkable and the patient tolerated the endoscopy without difficulty.  Alphonsa Overall, MD, [facility] Surgery Pager: [phone] Office phone:  [phone]

## 2016-05-14 NOTE — Discharge Instructions (Addendum)

## 2016-05-14 NOTE — Anesthesia Postprocedure Evaluation (Signed)
Anesthesia Post Note  Patient: Stephanie [name]  Procedure(s) Performed: Procedure(s) (LRB): LAPAROSCOPIC GASTRIC SLEEVE RESECTION WITH POSSIBLE HIATAL HERNIA REPAIR, UPPER ENDO (N/A)  Patient location during evaluation: PACU Anesthesia Type: General Level of consciousness: awake and alert, oriented and patient cooperative Pain management: pain level controlled Vital Signs Assessment: post-procedure vital signs reviewed and stable Respiratory status: spontaneous breathing, nonlabored ventilation and respiratory function stable Cardiovascular status: blood pressure returned to baseline and stable Postop Assessment: no signs of nausea or vomiting Anesthetic complications: no    Last Vitals:  Vitals:   05/14/16 1830 05/14/16 1928  BP: 122/80 109/65  Pulse: 73 67  Resp: 17 16  Temp: 36.8 C 36.8 C    Last Pain:  Vitals:   05/14/16 1928  TempSrc: Oral  PainSc:                  Zaccai Chavarin,E. Shawnie Nicole

## 2016-05-14 NOTE — Interval H&P Note (Signed)
History and Physical Interval Note:  05/14/2016 1:15 PM  Stephanie [name]  has presented today for surgery, with the diagnosis of morbid obesity, htn  The various methods of treatment have been discussed with the patient and family. After consideration of risks, benefits and other options for treatment, the patient has consented to  Procedure(s): LAPAROSCOPIC GASTRIC SLEEVE RESECTION WITH POSSIBLE HIATAL HERNIA REPAIR, UPPER ENDO (N/A) as a surgical intervention .  The patient's history has been reviewed, patient examined, no change in status, stable for surgery.  I have reviewed the patient's chart and labs.  Questions were answered to the patient's satisfaction.     Betzabe Bevans B

## 2016-05-14 NOTE — Op Note (Signed)
Surgeon: [name], MD, FACS  Asst:  Alphonsa Overall, MD, FACS  Anes:  General endotracheal  Procedure: Laparoscopic sleeve gastrectomy and upper endoscopy; posterior two suture repair of hiatal hernia  Diagnosis: Morbid obesity  Complications: none  EBL:   14 cc  Description of Procedure:  The patient was take to OR 1 and given general anesthesia.  The abdomen was prepped with PCMX and draped sterilely.  A timeout was performed.  Access to the abdomen was achieved with a five mm Optiview through the left upper quadrant.  Following insufflation, the state of the abdomen was found to be with a few adhesions which were lysed with the Harmonic scalpel.  The balloon test showed a small hiatal hernia.  This was dissected posteriorly and repaired with two sutures of 0 Surgidek secured with Ty Knots.  The balloon test was repeated and was snug.  The ViSiGi 36Fr tube was inserted to deflate the stomach and was pulled back into the esophagus.    The pylorus was identified and we measured 5 cm back and marked the antrum.  At that point we began dissection to take down the greater curvature of the stomach using the Harmonic scalpel.  This dissection was taken all the way up to the left crus.  Posterior attachments of the stomach were also taken down.    The ViSiGi tube was then passed into the antrum and suction applied so that it was snug along the lessor curvature.  The "crow's foot" or incisura was identified.  The sleeve gastrectomy was begun using the [employer] stapler beginning with a 4.5 Black Covidien load with TRS.  This had a second 6 cm Black followed by purple loads all with TRS.  When the sleeve was complete the tube was taken off suction and insufflated briefly.  The tube was withdrawn.  Upper endoscopy was then performed by Dr. Lucia Gaskins.     The specimen was extracted through the 15 trocar site.  Wounds were infiltrated with Exparel and closed with 4-0 Monocryl.  The 15 mm trocar  site was closed with a 0 Vicryl with Endoclose.    Matt B. Hassell Done, [place], [facility], [place]

## 2016-05-14 NOTE — Transfer of Care (Signed)
Immediate Anesthesia Transfer of Care Note  Patient: Stephanie [name]  Procedure(s) Performed: Procedure(s): LAPAROSCOPIC GASTRIC SLEEVE RESECTION WITH POSSIBLE HIATAL HERNIA REPAIR, UPPER ENDO (N/A)  Patient Location: PACU  Anesthesia Type:General  Level of Consciousness:  sedated, patient cooperative and responds to stimulation  Airway & Oxygen Therapy:Patient Spontanous Breathing and Patient connected to face mask oxgen  Post-op Assessment:  Report given to PACU RN and Post -op Vital signs reviewed and stable  Post vital signs:  Reviewed and stable  Last Vitals:  Vitals:   05/14/16 1035  BP: (!) 144/92  Pulse: 73  Resp: 16  Temp: 123XX123 C    Complications: No apparent anesthesia complications

## 2016-05-14 NOTE — Anesthesia Preprocedure Evaluation (Addendum)
Anesthesia Evaluation  Patient identified by MRN, date of birth, ID band Patient awake    Reviewed: Allergy & Precautions, NPO status , Patient's Chart, lab work & pertinent test results  History of Anesthesia Complications Negative for: history of anesthetic complications  Airway Mallampati: I  TM Distance: >3 FB Neck ROM: Full    Dental  (+) Teeth Intact, Dental Advisory Given   Pulmonary neg pulmonary ROS,    breath sounds clear to auscultation       Cardiovascular hypertension, Pt. on medications (-) angina Rhythm:Regular Rate:Normal     Neuro/Psych  Headaches,    GI/Hepatic Neg liver ROS, GERD  Medicated and Controlled,  Endo/Other  Hypothyroidism Morbid obesity  Renal/GU negative Renal ROS     Musculoskeletal   Abdominal (+) + obese,   Peds  Hematology   Anesthesia Other Findings   Reproductive/Obstetrics                            Anesthesia Physical Anesthesia Plan  ASA: III  Anesthesia Plan: General   Post-op Pain Management:    Induction: Intravenous  Airway Management Planned: Oral ETT  Additional Equipment:   Intra-op Plan:   Post-operative Plan:   Informed Consent:   Dental advisory given  Plan Discussed with: CRNA and Surgeon  Anesthesia Plan Comments: (Plan routine monitors, GETA)       Anesthesia Quick Evaluation

## 2016-05-14 NOTE — Anesthesia Procedure Notes (Signed)
Procedure Name: Intubation Date/Time: 05/14/2016 1:28 PM Performed by: Talbot Grumbling Pre-anesthesia Checklist: Patient identified, Emergency Drugs available, Patient being monitored and Suction available Patient Re-evaluated:Patient Re-evaluated prior to inductionOxygen Delivery Method: Circle system utilized Preoxygenation: Pre-oxygenation with 100% oxygen Intubation Type: IV induction Ventilation: Mask ventilation without difficulty Laryngoscope Size: Mac and 3 Grade View: Grade I Tube type: Oral Tube size: 7.5 mm Number of attempts: 1 Airway Equipment and Method: Stylet Placement Confirmation: ETT inserted through vocal cords under direct vision,  positive ETCO2 and breath sounds checked- equal and bilateral Secured at: 21 cm Tube secured with: Tape Dental Injury: Teeth and Oropharynx as per pre-operative assessment

## 2016-05-15 LAB — CBC WITH DIFFERENTIAL/PLATELET
Basophils Absolute: 0 10*3/uL (ref 0.0–0.1)
Basophils Relative: 0 %
Eosinophils Absolute: 0 10*3/uL (ref 0.0–0.7)
Eosinophils Relative: 0 %
HCT: 38.5 % (ref 36.0–46.0)
Hemoglobin: 13.2 g/dL (ref 12.0–15.0)
Lymphocytes Relative: 11 %
Lymphs Abs: 1.3 10*3/uL (ref 0.7–4.0)
MCH: 30.8 pg (ref 26.0–34.0)
MCHC: 34.3 g/dL (ref 30.0–36.0)
MCV: 89.7 fL (ref 78.0–100.0)
Monocytes Absolute: 0.5 10*3/uL (ref 0.1–1.0)
Monocytes Relative: 4 %
Neutro Abs: 10.6 10*3/uL — ABNORMAL HIGH (ref 1.7–7.7)
Neutrophils Relative %: 85 %
Platelets: 325 10*3/uL (ref 150–400)
RBC: 4.29 MIL/uL (ref 3.87–5.11)
RDW: 12.9 % (ref 11.5–15.5)
WBC: 12.4 10*3/uL — ABNORMAL HIGH (ref 4.0–10.5)

## 2016-05-15 LAB — CBC
HCT: 41.2 % (ref 36.0–46.0)
Hemoglobin: 14.4 g/dL (ref 12.0–15.0)
MCH: 31 pg (ref 26.0–34.0)
MCHC: 35 g/dL (ref 30.0–36.0)
MCV: 88.6 fL (ref 78.0–100.0)
Platelets: UNDETERMINED 10*3/uL (ref 150–400)
RBC: 4.65 MIL/uL (ref 3.87–5.11)
RDW: 12.9 % (ref 11.5–15.5)
WBC: 12.8 10*3/uL — ABNORMAL HIGH (ref 4.0–10.5)

## 2016-05-15 MED ORDER — ONDANSETRON 4 MG PO TBDP: 4.0000 mg | ORAL_TABLET | Freq: Three times a day (TID) | ORAL | 0 refills | Status: DC | PRN

## 2016-05-15 MED ORDER — ACETAMINOPHEN-CODEINE 120-12 MG/5ML PO SOLN
5.0000 mL | ORAL | Status: DC | PRN
  Filled 2016-05-15: qty 5

## 2016-05-15 MED ORDER — ACETAMINOPHEN-CODEINE 120-12 MG/5ML PO SOLN: 5.0000 mL | ORAL | 0 refills | Status: DC | PRN

## 2016-05-15 MED FILL — ACETAMINOPHEN-CODEINE ELX: 120-12 | 4 days supply | Qty: 120 | Fill #0

## 2016-05-15 NOTE — Progress Notes (Signed)
Patient alert and oriented, pain is controlled. Patient is tolerating fluids, advanced to protein shake today, patient is tolerating well.  Reviewed Gastric sleeve discharge instructions with patient and patient is able to articulate understanding.  Provided information on BELT program, Support Group and [facility] outpatient pharmacy. All questions answered, will continue to monitor.  

## 2016-05-15 NOTE — Plan of Care (Signed)
Problem: Food- and Nutrition-Related Knowledge Deficit (NB-1.1) Goal: Nutrition education Formal process to instruct or train a patient/client in a skill or to impart knowledge to help patients/clients voluntarily manage or modify food choices and eating behavior to maintain or improve health. Outcome: Completed/Met Date Met: 05/15/16 Nutrition Education Note  Received consult for diet education per DROP protocol.   Discussed 2 week post op diet with pt. Emphasized that liquids must be non carbonated, non caffeinated, and sugar free. Fluid goals discussed. Reviewed progression of diet to include soft proteins at 7-10 days post-op. Pt to follow up with outpatient bariatric RD for further diet progression after 2 weeks. Multivitamins and minerals also reviewed. Teach back method used, pt expressed understanding, expect good compliance.   Diet: First 2 Weeks  You will see the dietitian about two (2) weeks after your surgery. The dietitian will increase the types of foods you can eat if you are handling liquids well:  If you have severe vomiting or nausea and cannot handle clear liquids lasting longer than 1 day, call your surgeon  Protein Shake  Drink at least 2 ounces of shake 5-6 times per day  Each serving of protein shakes (usually 8 - 12 ounces) should have a minimum of:  15 grams of protein  And no more than 5 grams of carbohydrate  Goal for protein each day:  Men = 80 grams per day  Women = 60 grams per day  Protein powder may be added to fluids such as non-fat milk or Lactaid milk or Soy milk (limit to 35 grams added protein powder per serving)   Hydration  Slowly increase the amount of water and other clear liquids as tolerated (See Acceptable Fluids)  Slowly increase the amount of protein shake as tolerated  Sip fluids slowly and throughout the day  May use sugar substitutes in small amounts (no more than 6 - 8 packets per day; i.e. Splenda)   Fluid Goal  The first goal is to  drink at least 8 ounces of protein shake/drink per day (or as directed by the nutritionist); some examples of protein shakes are Syntrax Nectar, Adkins Advantage, EAS Edge HP, and Unjury. See handout from pre-op Bariatric Education Class:  Slowly increase the amount of protein shake you drink as tolerated  You may find it easier to slowly sip shakes throughout the day  It is important to get your proteins in first  Your fluid goal is to drink 64 - 100 ounces of fluid daily  It may take a few weeks to build up to this  32 oz (or more) should be clear liquids  And  32 oz (or more) should be full liquids (see below for examples)  Liquids should not contain sugar, caffeine, or carbonation   Clear Liquids:  Water or Sugar-free flavored water (i.e. Fruit H2O, Propel)  Decaffeinated coffee or tea (sugar-free)  Crystal Lite, Wyler's Lite, Minute Maid Lite  Sugar-free Jell-O  Bouillon or broth  Sugar-free Popsicle: *Less than 20 calories each; Limit 1 per day   Full Liquids:  Protein Shakes/Drinks + 2 choices per day of other full liquids  Full liquids must be:  No More Than 12 grams of Carbs per serving  No More Than 3 grams of Fat per serving  Strained low-fat cream soup  Non-Fat milk  Fat-free Lactaid Milk  Sugar-free yogurt (Dannon Lite & Fit, Greek yogurt)     Kobie Whidby, MS, RD, LDN Pager: [phone] After Hours Pager: [phone]    

## 2016-05-15 NOTE — Discharge Summary (Signed)
Physician Discharge Summary  Patient ID: Stephanie [name] MRN: [id number] DOB/AGE: 1960/10/15 55 y.o.  Admit date: 05/14/2016 Discharge date: 05/15/2016  Admission Diagnoses:  Morbid obesity  Discharge Diagnoses:  same  Active Problems:   S/P laparoscopic sleeve gastrectomy Dec 2017   Surgery:  Lap sleeve gastrectomy  Discharged Condition: improved  Hospital Course:   Had sleeve gastrectomy on Tuesday.  Had headache from oxycodone-requested codeine for discharge.  Tolerated liquids and ready for discharge on Wednesday.  Consults: none  Significant Diagnostic Studies: none    Discharge Exam: Blood pressure 140/79, pulse 61, temperature 98.6 F (37 C), temperature source Oral, resp. rate 14, height 5' 4.5" (1.638 m), weight 121.1 kg (267 lb), last menstrual period 05/07/2016, SpO2 95 %. Incisions OK  Disposition: 01-Home or Self Care  Discharge Instructions    Ambulate hourly while awake    Complete by:  As directed    Call MD for:  difficulty breathing, headache or visual disturbances    Complete by:  As directed    Call MD for:  persistant dizziness or light-headedness    Complete by:  As directed    Call MD for:  persistant nausea and vomiting    Complete by:  As directed    Call MD for:  redness, tenderness, or signs of infection (pain, swelling, redness, odor or green/yellow discharge around incision site)    Complete by:  As directed    Call MD for:  severe uncontrolled pain    Complete by:  As directed    Call MD for:  temperature >101 F    Complete by:  As directed    Diet bariatric full liquid    Complete by:  As directed    Incentive spirometry    Complete by:  As directed    Perform hourly while awake       Medication List    TAKE these medications   acetaminophen-codeine 120-12 MG/5ML solution Take 5 mLs by mouth every 4 (four) hours as needed for moderate pain.   amLODipine 10 MG tablet Commonly known as:  NORVASC Take 1 tablet (10 mg total)  by mouth daily.   benazepril 20 MG tablet Commonly known as:  LOTENSIN Take 1 tablet (20 mg total) by mouth daily.   CALCIUM PO Take 1,000 mg by mouth daily.   loratadine-pseudoephedrine 10-240 MG 24 hr tablet Commonly known as:  CLARITIN-D 24-hour Take 1 tablet by mouth daily as needed for allergies.   multivitamin with minerals Tabs tablet Take 1 tablet by mouth daily.   ondansetron 4 MG disintegrating tablet Commonly known as:  ZOFRAN ODT Take 1 tablet (4 mg total) by mouth every 8 (eight) hours as needed for nausea or vomiting.   spironolactone 100 MG tablet Commonly known as:  ALDACTONE Take 100 mg by mouth daily.   SYNTHROID 75 MCG tablet Generic drug:  levothyroxine Take 75 mcg by mouth daily before breakfast.   Vitamin D 2000 units Caps Take 2,000 Units by mouth daily.      Follow-up Information    Pedro Earls, MD Follow up.   Specialty:  General Surgery Contact information: [address] [place] [address] [phone]           Signed: Pedro Earls 05/15/2016, 10:32 AM

## 2016-05-21 ENCOUNTER — Ambulatory Visit: Payer: 59

## 2016-05-28 ENCOUNTER — Encounter: Payer: 59 | Attending: Surgery | Admitting: Skilled Nursing Facility1

## 2016-05-28 DIAGNOSIS — Z713 Dietary counseling and surveillance: Secondary | ICD-10-CM | POA: Diagnosis not present

## 2016-05-28 DIAGNOSIS — Z6841 Body Mass Index (BMI) 40.0 and over, adult: Secondary | ICD-10-CM | POA: Insufficient documentation

## 2016-05-28 DIAGNOSIS — E669 Obesity, unspecified: Secondary | ICD-10-CM

## 2016-05-28 NOTE — Progress Notes (Signed)
Bariatric Class:  Appt start time: 1530 end time:  1630.  2 Week Post-Operative Nutrition Class  Patient was seen on 05/28/2016 for Post-Operative Nutrition [facility].    Surgery date:  Surgery type: Sleeve gastrectomy [facility] weight at [facility]: 270 lbs on 03/28/2016 Weight today: 249 pounds Wt change: (pre-op to 05/28/2016): 21  TANITA  BODY COMP RESULTS  04/29/16 05/28/2016   BMI (kg/m^2) 45.6 42.1   Fat Mass (lbs) 146.8 133   Fat Free Mass (lbs) 123.2 116   Total Body Water (lbs) 90.8 85    The following the learning objectives were met by the patient during this course:  Identifies Phase 3A (Soft, High Proteins) Dietary Goals and will begin from 2 weeks post-operatively to 2 months post-operatively  Identifies appropriate sources of fluids and proteins   States protein recommendations and appropriate sources post-operatively  Identifies the need for appropriate texture modifications, mastication, and bite sizes when consuming solids  Identifies appropriate multivitamin and calcium sources post-operatively  Describes the need for physical activity post-operatively and will follow MD recommendations  States when to call healthcare provider regarding medication questions or post-operative complications  Handouts given during class include:  Phase 3A: Soft, High Protein Diet Handout  Follow-Up Plan: Patient will follow-up at [facility] in 6 weeks for 2 month post-op nutrition visit for diet advancement per MD.

## 2016-05-29 ENCOUNTER — Encounter: Payer: Self-pay | Admitting: Skilled Nursing Facility1

## 2016-06-06 ENCOUNTER — Telehealth (HOSPITAL_COMMUNITY): Payer: Self-pay

## 2016-07-16 ENCOUNTER — Encounter: Payer: 59 | Attending: Surgery | Admitting: Dietician

## 2016-07-16 DIAGNOSIS — E669 Obesity, unspecified: Secondary | ICD-10-CM

## 2016-07-16 DIAGNOSIS — Z6841 Body Mass Index (BMI) 40.0 and over, adult: Secondary | ICD-10-CM | POA: Insufficient documentation

## 2016-07-16 DIAGNOSIS — Z713 Dietary counseling and surveillance: Secondary | ICD-10-CM | POA: Diagnosis not present

## 2016-07-16 NOTE — Progress Notes (Signed)
  Follow-up visit:  8 Weeks Post-Operative Sleeve gastrectomy Surgery  Medical Nutrition Therapy:  Appt start time: 410 end time:  455  Primary concerns today: Post-operative Bariatric Surgery Nutrition Management.  Jullia returns having lost a total of 32 lbs. She reports having pain and thinks this may be due to hernia repair. Having significant abdominal discomfort when she eats. Notes that this is slowly improving though. Relieved with anti-gas chewable. No vomiting. No longer likes the taste of protein shakes, "too sweet." Starting to feel hunger again. Has tried some veggies, tolerates roasted zucchini. Tried a salad and did not tolerate. Has tried some low carb tortilla.   Surgery date: 05/14/2016 Surgery type: Sleeve gastrectomy Start weight at [facility]: 270 lbs on 03/28/2016 Weight today: 237.8 lbs Wt change: 11.2 lbs Total weight lost: 32.2 lbs  TANITA  BODY COMP RESULTS  04/29/16 05/28/16 07/16/16   BMI (kg/m^2) 45.6 42.1 40.2   Fat Mass (lbs) 146.8 133 114.5   Fat Free Mass (lbs) 123.2 116 123.2   Total Body Water (lbs) 90.8 85 89.6    Preferred Learning Style:   No preference indicated   Learning Readiness:   Ready  24-hr recall: B (7:30 AM): Trim, healthy mama shake (23g) Snk (10:30 AM): cheese (6g) L (PM): ground beef with melted cheese (15-20g) Snk (PM): 1/4 Complete cookie (2g) D (PM): 3 large scallops (14-21g) Snk (PM): usually none, sometimes cheese  Fluid intake: at least 60 oz water, Bai drinks Estimated total protein intake: 40-60 g/day  Medications: see list Supplementation: taking  Drinking while eating: no Hair loss: no Carbonated beverages: no N/V/D/C: none Dumping syndrome: no  Recent physical activity:  Inconsistent, trying to be more active overall  Progress Towards Goal(s):  In progress.  Handouts given during visit include:  Phase 3B lean protein + non starchy vegetables   Nutritional Diagnosis:  [place]-3.3 Overweight/obesity related  to past poor dietary habits and physical inactivity as evidenced by patient w/ recent sleeve gastrectomy surgery following dietary guidelines for continued weight loss.    Intervention:  Nutrition counseling provided.  Teaching Method Utilized:  Visual Auditory Hands on  Barriers to learning/adherence to lifestyle change: none  Demonstrated degree of understanding via:  Teach Back   Monitoring/Evaluation:  Dietary intake, exercise, and body weight. Follow up prn.

## 2016-07-16 NOTE — Patient Instructions (Addendum)
Goals:  Follow Phase 3B: High Protein + Non-Starchy Vegetables  Eat 3-6 small meals/snacks, every 3-5 hrs  Increase lean protein foods to meet 60g goal  Increase fluid intake to 64oz +  Avoid drinking 15 minutes before, during and 30 minutes after eating  Aim for >30 min of physical activity daily  Experiment with [employer] and [employer] meatless products   Surgery date: 05/14/2016 Surgery type: Sleeve gastrectomy Start weight at [facility]: 270 lbs on 03/28/2016 Weight today: 237.8 lbs Wt change: 11.2 lbs Total weight lost: 32.2 lbs  TANITA  BODY COMP RESULTS  04/29/16 05/28/16 07/16/16   BMI (kg/m^2) 45.6 42.1 40.2   Fat Mass (lbs) 146.8 133 114.5   Fat Free Mass (lbs) 123.2 116 123.2   Total Body Water (lbs) 90.8 85 89.6

## 2016-09-04 ENCOUNTER — Other Ambulatory Visit: Payer: Self-pay | Admitting: Internal Medicine

## 2016-09-04 DIAGNOSIS — Z1231 Encounter for screening mammogram for malignant neoplasm of breast: Secondary | ICD-10-CM

## 2016-09-24 ENCOUNTER — Ambulatory Visit
Admission: RE | Admit: 2016-09-24 | Discharge: 2016-09-24 | Disposition: A | Discharge status: Home / Self Care | Payer: 59 | Source: Ambulatory Visit | Attending: Internal Medicine | Admitting: Internal Medicine

## 2016-09-24 DIAGNOSIS — Z1231 Encounter for screening mammogram for malignant neoplasm of breast: Secondary | ICD-10-CM

## 2017-06-05 ENCOUNTER — Other Ambulatory Visit: Payer: Self-pay | Admitting: Internal Medicine

## 2017-06-05 DIAGNOSIS — E042 Nontoxic multinodular goiter: Secondary | ICD-10-CM

## 2017-06-12 ENCOUNTER — Ambulatory Visit
Admission: RE | Admit: 2017-06-12 | Discharge: 2017-06-12 | Disposition: A | Discharge status: Home / Self Care | Payer: 59 | Source: Ambulatory Visit | Attending: Internal Medicine | Admitting: Internal Medicine

## 2017-06-12 ENCOUNTER — Other Ambulatory Visit: Payer: 59

## 2017-06-12 DIAGNOSIS — E042 Nontoxic multinodular goiter: Secondary | ICD-10-CM

## 2017-06-23 ENCOUNTER — Other Ambulatory Visit: Payer: Self-pay | Admitting: Internal Medicine

## 2017-06-23 DIAGNOSIS — E042 Nontoxic multinodular goiter: Secondary | ICD-10-CM

## 2017-07-03 ENCOUNTER — Other Ambulatory Visit (HOSPITAL_COMMUNITY)
Admission: RE | Admit: 2017-07-03 | Discharge: 2017-07-03 | Disposition: A | Discharge status: Home / Self Care | Payer: 59 | Source: Ambulatory Visit | Attending: General Surgery | Admitting: General Surgery

## 2017-07-03 ENCOUNTER — Ambulatory Visit
Admission: RE | Admit: 2017-07-03 | Discharge: 2017-07-03 | Disposition: A | Discharge status: Home / Self Care | Payer: 59 | Source: Ambulatory Visit | Attending: Internal Medicine | Admitting: Internal Medicine

## 2017-07-03 DIAGNOSIS — E041 Nontoxic single thyroid nodule: Secondary | ICD-10-CM | POA: Insufficient documentation

## 2017-07-03 DIAGNOSIS — E042 Nontoxic multinodular goiter: Secondary | ICD-10-CM

## 2017-07-21 ENCOUNTER — Encounter (HOSPITAL_COMMUNITY): Payer: Self-pay

## 2017-08-13 ENCOUNTER — Ambulatory Visit: Payer: Self-pay | Admitting: Surgery

## 2017-08-21 NOTE — Patient Instructions (Signed)
Stephanie [name]  08/21/2017   Your procedure is scheduled on: 09-02-17   Report to [facility] Main  Entrance ARRIVE AT 530 AM. Have a seat in the Main Lobby. Please note there is a phone at the [employer]. Please call [phone] on that phone. Someone from Short Stay will come and get you from the Main Lobby and take you to Short Stay.    Call this number if you have problems the morning of surgery [phone]   Remember: Do not eat food or drink liquids :After Midnight.     Take these medicines the morning of surgery with A SIP OF WATER: Synthroid                                You may not have any metal on your body including hair pins and              piercings  Do not wear jewelry, make-up, lotions, powders or perfumes, deodorant             Do not wear nail polish.  Do not shave  48 hours prior to surgery.                Do not bring valuables to the hospital. [place].  Contacts, dentures or bridgework may not be worn into surgery.  Leave suitcase in the car. After surgery it may be brought to your room.               Please read over the following fact sheets you were given: _____________________________________________________________________           [facility] - Preparing for Surgery Before surgery, you can play an important role.  Because skin is not sterile, your skin needs to be as free of germs as possible.  You can reduce the number of germs on your skin by washing with CHG (chlorahexidine gluconate) soap before surgery.  CHG is an antiseptic cleaner which kills germs and bonds with the skin to continue killing germs even after washing. Please DO NOT use if you have an allergy to CHG or antibacterial soaps.  If your skin becomes reddened/irritated stop using the CHG and inform your nurse when you arrive at Short Stay. Do not shave (including legs and underarms) for at least 48  hours prior to the first CHG shower.  You may shave your face/neck. Please follow these instructions carefully:  1.  Shower with CHG Soap the night before surgery and the  morning of Surgery.  2.  If you choose to wash your hair, wash your hair first as usual with your  normal  shampoo.  3.  After you shampoo, rinse your hair and body thoroughly to remove the  shampoo.                           4.  Use CHG as you would any other liquid soap.  You can apply chg directly  to the skin and wash                       Gently with a scrungie or clean washcloth.  5.  Apply the CHG Soap to your body ONLY FROM THE NECK DOWN.   Do not use on face/ open                           Wound or open sores. Avoid contact with eyes, ears mouth and genitals (private parts).                       Wash face,  Genitals (private parts) with your normal soap.             6.  Wash thoroughly, paying special attention to the area where your surgery  will be performed.  7.  Thoroughly rinse your body with warm water from the neck down.  8.  DO NOT shower/wash with your normal soap after using and rinsing off  the CHG Soap.                9.  Pat yourself dry with a clean towel.            10.  Wear clean pajamas.            11.  Place clean sheets on your bed the night of your first shower and do not  sleep with pets. Day of Surgery : Do not apply any lotions/deodorants the morning of surgery.  Please wear clean clothes to the hospital/surgery center.  FAILURE TO FOLLOW THESE INSTRUCTIONS MAY RESULT IN THE CANCELLATION OF YOUR SURGERY PATIENT SIGNATURE_________________________________  NURSE SIGNATURE__________________________________  ________________________________________________________________________

## 2017-08-25 ENCOUNTER — Ambulatory Visit (HOSPITAL_COMMUNITY)
Admission: RE | Admit: 2017-08-25 | Discharge: 2017-08-25 | Disposition: A | Discharge status: Home / Self Care | Payer: 59 | Source: Ambulatory Visit | Attending: Anesthesiology | Admitting: Anesthesiology

## 2017-08-25 ENCOUNTER — Encounter (HOSPITAL_COMMUNITY)
Admission: RE | Admit: 2017-08-25 | Discharge: 2017-08-25 | Disposition: A | Discharge status: Home / Self Care | Payer: 59 | Source: Ambulatory Visit | Attending: Surgery | Admitting: Surgery

## 2017-08-25 ENCOUNTER — Other Ambulatory Visit: Payer: Self-pay

## 2017-08-25 ENCOUNTER — Encounter (HOSPITAL_COMMUNITY): Payer: Self-pay

## 2017-08-25 DIAGNOSIS — I10 Essential (primary) hypertension: Secondary | ICD-10-CM | POA: Diagnosis not present

## 2017-08-25 DIAGNOSIS — Z01811 Encounter for preprocedural respiratory examination: Secondary | ICD-10-CM | POA: Insufficient documentation

## 2017-08-25 DIAGNOSIS — Z01810 Encounter for preprocedural cardiovascular examination: Secondary | ICD-10-CM | POA: Diagnosis not present

## 2017-08-25 DIAGNOSIS — E042 Nontoxic multinodular goiter: Secondary | ICD-10-CM | POA: Insufficient documentation

## 2017-08-25 DIAGNOSIS — D440 Neoplasm of uncertain behavior of thyroid gland: Secondary | ICD-10-CM | POA: Insufficient documentation

## 2017-08-25 DIAGNOSIS — R9431 Abnormal electrocardiogram [ECG] [EKG]: Secondary | ICD-10-CM | POA: Diagnosis not present

## 2017-08-25 LAB — CBC
HCT: 39.1 % (ref 36.0–46.0)
Hemoglobin: 13.5 g/dL (ref 12.0–15.0)
MCH: 31.8 pg (ref 26.0–34.0)
MCHC: 34.5 g/dL (ref 30.0–36.0)
MCV: 92.2 fL (ref 78.0–100.0)
Platelets: 256 10*3/uL (ref 150–400)
RBC: 4.24 MIL/uL (ref 3.87–5.11)
RDW: 12.4 % (ref 11.5–15.5)
WBC: 6.5 10*3/uL (ref 4.0–10.5)

## 2017-08-25 LAB — BASIC METABOLIC PANEL
Anion gap: 7 (ref 5–15)
BUN: 15 mg/dL (ref 6–20)
CO2: 27 mmol/L (ref 22–32)
Calcium: 8.9 mg/dL (ref 8.9–10.3)
Chloride: 107 mmol/L (ref 101–111)
Creatinine, Ser: 0.61 mg/dL (ref 0.44–1.00)
GFR calc Af Amer: >60 mL/min (ref >60)
GFR calc non Af Amer: >60 mL/min (ref >60)
Glucose, Bld: 82 mg/dL (ref 65–99)
Potassium: 3.5 mmol/L (ref 3.5–5.1)
Sodium: 141 mmol/L (ref 135–145)

## 2017-08-25 LAB — HCG, SERUM, QUALITATIVE: Preg, Serum: NEGATIVE

## 2017-08-25 NOTE — Progress Notes (Signed)
At PAT appt ;patient , upon hearing what time she is to check in for surgery, appeared upset and exclaimed "No! My surgery is at 0930 and I was told to check in at 0730. RN explained that PAT has no say in the  determination of surgery time or date and that if she needs to move her surgery time, she would need to contact her surgeon's office. Patient then excalimed , " well [they] called me last week and didn't say anything about it changing". RN replied that surgery times often change but is the surgeon's office that controls it. Patient states that she will be calling the surgeon's office today to get them to change it. Patient also asked if she had to change the date do I have to come back here. RN informed that the things done at PAT visit are good for 30 days and she would not have to return unless they moved her surgery date greater than 30 days from this PAT visit. Patient verbalize understanding.

## 2017-08-31 ENCOUNTER — Encounter (HOSPITAL_COMMUNITY): Payer: Self-pay | Admitting: Surgery

## 2017-08-31 DIAGNOSIS — D440 Neoplasm of uncertain behavior of thyroid gland: Secondary | ICD-10-CM | POA: Diagnosis present

## 2017-08-31 DIAGNOSIS — E042 Nontoxic multinodular goiter: Secondary | ICD-10-CM | POA: Diagnosis present

## 2017-08-31 NOTE — H&P (Signed)
General Surgery [facility]  Stephanie [name] DOB: Nov 22, 1960 Single / Language: Stephanie [name] / Race: White Female   History of Present Illness  The patient is a 57 year old female who presents with a thyroid nodule.  CC: thyroid neoplasm of uncertain behavior  Patient is referred by Dr. 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 Patient had initially been noted with thyroid nodules on little over 2 years ago. She has been followed with sequential ultrasound scanning. There was interval enlargement of a nodule in the thyroid isthmus now measuring 3.7 cm in size. There were 2 dominant nodules in the right thyroid lobe and one in the left thyroid lobe. Biopsy of the dominant nodule in the isthmus was performed and showed atypia. Patient subsequently underwent molecular genetic testing which yielded a result of suspicious, giving her a risk of malignancy of at least 50%. Patient is now referred for consideration for thyroidectomy for definitive diagnosis and management. TSH level is normal at 0.57. Patient has hypothyroidism and has been on thyroid medication for many years. She currently takes Synthroid 75 g daily. She has had no prior head or neck surgery. There is a family history of hypothyroidism and the patient's mother and sister. There is no family history of other endocrine neoplasms. Patient denies any tremors or palpitations. She is a Financial planner.   Problem List/Past Medical MORBID OBESITY, UNSPECIFIED OBESITY TYPE (E66.01)  Two weeks prior to surgery Go on the extremely low carb liquid diet One week prior to surgery No aspirin products. Tylenol is acceptable Stop smoking 24 hours prior to surgery No alcoholic beverages Report fever greater than 100.5 or excessive nasal drainage suggesting infection Continue bariatric preop diet Perform bowel prep if ordered Do not  eat or drink anything after midnight the night before surgery Do not take any medications except those instructed by the anesthesiologist Morning of surgery Please arrive at the hospital at least 2 hours before your scheduled surgery time. No makeup, fingernail polish or jewelry Bring insurance cards with you Bring your CPAP mask if you use this MULTIPLE THYROID NODULES (E04.2)  NEOPLASM OF UNCERTAIN BEHAVIOR OF THYROID GLAND (D44.0)   Past Surgical History Breast Reconstruction  Bilateral. Cesarean Section - 1  Colon Polyp Removal - Colonoscopy  Gallbladder Surgery - Laparoscopic  Oral Surgery   Diagnostic Studies History Colonoscopy  within last year Mammogram  within last year Pap Smear  1-5 years ago  Allergies Morphine Sulfate (Concentrate) *ANALGESICS - OPIOID*   Medication History Benazepril HCl (20MG  Tablet, Oral) Active. Synthroid (75MCG Tablet, Oral) Active. Multivitamin Adult (Oral) Active. Medications Reconciled  Social History Alcohol use  Occasional alcohol use. Caffeine use  Carbonated beverages, Tea. No drug use  Tobacco use  Never smoker.  Family History Arthritis  Mother. Cancer  Mother. Cerebrovascular Accident  Mother. Colon Polyps  Father. Heart disease in female family member before age 77  Heart disease in female family member before age 72  Hypertension  Father.  Pregnancy / Birth History Age at menarche  31 years. Contraceptive History  Oral contraceptives. 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)   Vitals Weight: 214 lb Height: 64.5in Body Surface Area: 2.02 m Body Mass Index: 36.17 kg/m  Temp.: 97.24F(Temporal)  Pulse: 75 (Regular)  BP: 118/74 (Sitting, Left  Arm, Standard)  Physical Exam  See vital signs recorded above  GENERAL  APPEARANCE Development: normal Nutritional status: normal Gross deformities: none  SKIN Rash, lesions, ulcers: none Induration, erythema: none Nodules: none palpable  EYES Conjunctiva and lids: normal Pupils: equal and reactive Iris: normal bilaterally  EARS, NOSE, MOUTH, THROAT External ears: no lesion or deformity External nose: no lesion or deformity Hearing: grossly normal Lips: no lesion or deformity Dentition: normal for age Oral mucosa: moist  NECK Symmetric: yes Trachea: midline Thyroid: There is a dominant visible mass in the midline of the neck. On palpation this is approximately a 3 cm moderately firm nodule in the isthmus of the thyroid which is mobile with swallowing. There are palpable nodules in the right thyroid lobe with swallowing. Left lobe is normal to palpation. There is no tenderness. There is no associated lymphadenopathy.  CHEST Respiratory effort: normal Retraction or accessory muscle use: no Breath sounds: normal bilaterally Rales, rhonchi, wheeze: none  CARDIOVASCULAR Auscultation: regular rhythm, normal rate Murmurs: none Pulses: carotid and radial pulse 2+ palpable Lower extremity edema: Mild bilateral at ankles Lower extremity varicosities: none  MUSCULOSKELETAL Station and gait: normal Digits and nails: no clubbing or cyanosis Muscle strength: grossly normal all extremities Range of motion: grossly normal all extremities Deformity: none  LYMPHATIC Cervical: none palpable Supraclavicular: none palpable  PSYCHIATRIC Oriented to person, place, and time: yes Mood and affect: normal for situation Judgment and insight: appropriate for situation  Assessment & Plan  NEOPLASM OF UNCERTAIN BEHAVIOR OF THYROID GLAND (D44.0)  MULTIPLE THYROID NODULES (E04.2)  Patient presents on referral from her endocrinologist to discuss thyroid surgery for management of multiple thyroid nodules and a dominant thyroid nodule with cytologic atypia.  Patient is provided with written literature on thyroid surgery to review at home.  We reviewed her ultrasound report and her biopsy results. Based on the fact that she has multiple bilateral thyroid nodules as well as significant atypia with a suspicious molecular genetic analysis, I have recommended proceeding with total thyroidectomy for definitive diagnosis and management. We discussed the procedure. We discussed the location of the surgical incision. We discussed the hospital stay to be anticipated. We discussed potential complications including recurrent laryngeal nerve injury and injury to parathyroid glands. We discussed postoperative recovery and the possible need for radioactive iodine treatment. We discussed the need for lifelong thyroid hormone replacement. Patient understands and wishes to proceed with surgery in the near future.  The risks and benefits of the procedure have been discussed at length with the patient. The patient understands the proposed procedure, potential alternative treatments, and the course of recovery to be expected. All of the patient's questions have been answered at this time. The patient wishes to proceed with surgery.   Armandina Gemma, [place] [facility] Office: [phone]

## 2017-09-02 ENCOUNTER — Encounter (HOSPITAL_COMMUNITY): Payer: Self-pay | Admitting: *Deleted

## 2017-09-02 ENCOUNTER — Other Ambulatory Visit: Payer: Self-pay

## 2017-09-02 ENCOUNTER — Ambulatory Visit (HOSPITAL_COMMUNITY): Payer: 59 | Admitting: Anesthesiology

## 2017-09-02 ENCOUNTER — Encounter (HOSPITAL_COMMUNITY)
Admission: RE | Disposition: A | Discharge status: Home / Self Care | Payer: Self-pay | Source: Ambulatory Visit | Attending: Surgery

## 2017-09-02 ENCOUNTER — Observation Stay (HOSPITAL_COMMUNITY)
Admission: RE | Admit: 2017-09-02 | Discharge: 2017-09-03 | Disposition: A | Discharge status: Home / Self Care | Payer: 59 | Source: Ambulatory Visit | Attending: Surgery | Admitting: Surgery

## 2017-09-02 DIAGNOSIS — Z79899 Other long term (current) drug therapy: Secondary | ICD-10-CM | POA: Insufficient documentation

## 2017-09-02 DIAGNOSIS — Z9884 Bariatric surgery status: Secondary | ICD-10-CM | POA: Insufficient documentation

## 2017-09-02 DIAGNOSIS — K219 Gastro-esophageal reflux disease without esophagitis: Secondary | ICD-10-CM | POA: Diagnosis not present

## 2017-09-02 DIAGNOSIS — Z885 Allergy status to narcotic agent status: Secondary | ICD-10-CM | POA: Diagnosis not present

## 2017-09-02 DIAGNOSIS — E039 Hypothyroidism, unspecified: Secondary | ICD-10-CM | POA: Diagnosis not present

## 2017-09-02 DIAGNOSIS — D34 Benign neoplasm of thyroid gland: Secondary | ICD-10-CM | POA: Insufficient documentation

## 2017-09-02 DIAGNOSIS — I10 Essential (primary) hypertension: Secondary | ICD-10-CM | POA: Insufficient documentation

## 2017-09-02 DIAGNOSIS — D489 Neoplasm of uncertain behavior, unspecified: Secondary | ICD-10-CM | POA: Diagnosis present

## 2017-09-02 DIAGNOSIS — Z6834 Body mass index (BMI) 34.0-34.9, adult: Secondary | ICD-10-CM | POA: Diagnosis not present

## 2017-09-02 DIAGNOSIS — C73 Malignant neoplasm of thyroid gland: Secondary | ICD-10-CM | POA: Diagnosis not present

## 2017-09-02 DIAGNOSIS — Z886 Allergy status to analgesic agent status: Secondary | ICD-10-CM | POA: Insufficient documentation

## 2017-09-02 DIAGNOSIS — E042 Nontoxic multinodular goiter: Secondary | ICD-10-CM | POA: Diagnosis present

## 2017-09-02 DIAGNOSIS — D440 Neoplasm of uncertain behavior of thyroid gland: Secondary | ICD-10-CM

## 2017-09-02 DIAGNOSIS — E063 Autoimmune thyroiditis: Secondary | ICD-10-CM | POA: Insufficient documentation

## 2017-09-02 HISTORY — PX: THYROIDECTOMY: SHX17

## 2017-09-02 SURGERY — THYROIDECTOMY
Anesthesia: General | Site: Neck

## 2017-09-02 MED ORDER — LIDOCAINE 2% (20 MG/ML) 5 ML SYRINGE
INTRAMUSCULAR | Status: DC | PRN
  Administered 2017-09-02: 50 mg via INTRAVENOUS

## 2017-09-02 MED ORDER — CHLORHEXIDINE GLUCONATE CLOTH 2 % EX PADS: 6.0000 | MEDICATED_PAD | Freq: Once | CUTANEOUS | Status: DC

## 2017-09-02 MED ORDER — MEPERIDINE HCL 50 MG/ML IJ SOLN: 25.0000 mg | INTRAMUSCULAR | Status: DC | PRN

## 2017-09-02 MED ORDER — HYDROMORPHONE HCL 1 MG/ML IJ SOLN
0.2500 mg | INTRAMUSCULAR | Status: DC | PRN
  Administered 2017-09-02 (×2): 0.5 mg via INTRAVENOUS

## 2017-09-02 MED ORDER — CEFAZOLIN SODIUM-DEXTROSE 2-4 GM/100ML-% IV SOLN
2.0000 g | INTRAVENOUS | Status: AC
  Administered 2017-09-02: 2 g via INTRAVENOUS
  Filled 2017-09-02: qty 100

## 2017-09-02 MED ORDER — LEVOTHYROXINE SODIUM 75 MCG PO TABS
75.0000 ug | ORAL_TABLET | Freq: Every day | ORAL | Status: DC
  Administered 2017-09-03: 75 ug via ORAL
  Filled 2017-09-02: qty 1

## 2017-09-02 MED ORDER — ACETAMINOPHEN 650 MG RE SUPP: 650.0000 mg | Freq: Four times a day (QID) | RECTAL | Status: DC | PRN

## 2017-09-02 MED ORDER — PROPOFOL 10 MG/ML IV BOLUS
INTRAVENOUS | Status: AC
  Filled 2017-09-02: qty 20

## 2017-09-02 MED ORDER — FENTANYL CITRATE (PF) 100 MCG/2ML IJ SOLN
INTRAMUSCULAR | Status: AC
  Filled 2017-09-02: qty 2

## 2017-09-02 MED ORDER — ONDANSETRON 4 MG PO TBDP: 4.0000 mg | ORAL_TABLET | Freq: Four times a day (QID) | ORAL | Status: DC | PRN

## 2017-09-02 MED ORDER — BENAZEPRIL HCL 10 MG PO TABS
20.0000 mg | ORAL_TABLET | Freq: Every day | ORAL | Status: DC
  Administered 2017-09-02: 20 mg via ORAL
  Filled 2017-09-02 (×3): qty 2

## 2017-09-02 MED ORDER — LACTATED RINGERS IV SOLN
INTRAVENOUS | Status: DC | PRN
  Administered 2017-09-02: 07:00:00 via INTRAVENOUS

## 2017-09-02 MED ORDER — HYDROMORPHONE HCL 1 MG/ML IJ SOLN
INTRAMUSCULAR | Status: AC
  Filled 2017-09-02: qty 1

## 2017-09-02 MED ORDER — FENTANYL CITRATE (PF) 100 MCG/2ML IJ SOLN
25.0000 ug | INTRAMUSCULAR | Status: DC | PRN
  Administered 2017-09-02 (×3): 50 ug via INTRAVENOUS

## 2017-09-02 MED ORDER — CALCIUM CARBONATE ANTACID 1250 MG/5ML PO SUSP
1000.0000 mg | Freq: Three times a day (TID) | ORAL | Status: DC
  Filled 2017-09-02: qty 10

## 2017-09-02 MED ORDER — FENTANYL CITRATE (PF) 100 MCG/2ML IJ SOLN
INTRAMUSCULAR | Status: DC | PRN
  Administered 2017-09-02: 100 ug via INTRAVENOUS
  Administered 2017-09-02 (×3): 50 ug via INTRAVENOUS

## 2017-09-02 MED ORDER — CALCIUM CITRATE-VITAMIN D 500-500 MG-UNIT PO CHEW
2.0000 | CHEWABLE_TABLET | Freq: Three times a day (TID) | ORAL | Status: DC
  Administered 2017-09-02: 18:00:00 2 via ORAL

## 2017-09-02 MED ORDER — SUGAMMADEX SODIUM 200 MG/2ML IV SOLN
INTRAVENOUS | Status: DC | PRN
  Administered 2017-09-02: 190 mg via INTRAVENOUS

## 2017-09-02 MED ORDER — TRAMADOL HCL 50 MG PO TABS
50.0000 mg | ORAL_TABLET | Freq: Four times a day (QID) | ORAL | Status: DC | PRN
  Filled 2017-09-02: qty 1

## 2017-09-02 MED ORDER — ROCURONIUM BROMIDE 10 MG/ML (PF) SYRINGE
PREFILLED_SYRINGE | INTRAVENOUS | Status: DC | PRN
  Administered 2017-09-02: 40 mg via INTRAVENOUS
  Administered 2017-09-02: 10 mg via INTRAVENOUS

## 2017-09-02 MED ORDER — LACTATED RINGERS IV SOLN
INTRAVENOUS | Status: DC
Start: 2017-09-02 — End: 2017-09-02
  Administered 2017-09-02: 1000 mL via INTRAVENOUS

## 2017-09-02 MED ORDER — IBUPROFEN 200 MG PO TABS
600.0000 mg | ORAL_TABLET | Freq: Three times a day (TID) | ORAL | Status: DC | PRN
  Administered 2017-09-02 – 2017-09-03 (×2): 600 mg via ORAL
  Filled 2017-09-02 (×2): qty 3

## 2017-09-02 MED ORDER — MIDAZOLAM HCL 5 MG/5ML IJ SOLN
INTRAMUSCULAR | Status: DC | PRN
  Administered 2017-09-02: 2 mg via INTRAVENOUS

## 2017-09-02 MED ORDER — DEXAMETHASONE SODIUM PHOSPHATE 10 MG/ML IJ SOLN
INTRAMUSCULAR | Status: DC | PRN
  Administered 2017-09-02: 10 mg via INTRAVENOUS

## 2017-09-02 MED ORDER — FENTANYL CITRATE (PF) 100 MCG/2ML IJ SOLN: 25.0000 ug | INTRAMUSCULAR | Status: DC | PRN

## 2017-09-02 MED ORDER — 0.9 % SODIUM CHLORIDE (POUR BTL) OPTIME
TOPICAL | Status: DC | PRN
  Administered 2017-09-02: 1000 mL

## 2017-09-02 MED ORDER — SODIUM CHLORIDE 0.9 % IJ SOLN
INTRAMUSCULAR | Status: AC
  Filled 2017-09-02: qty 10

## 2017-09-02 MED ORDER — ONDANSETRON HCL 4 MG/2ML IJ SOLN
INTRAMUSCULAR | Status: DC | PRN
  Administered 2017-09-02: 4 mg via INTRAVENOUS

## 2017-09-02 MED ORDER — FENTANYL CITRATE (PF) 250 MCG/5ML IJ SOLN
INTRAMUSCULAR | Status: AC
Start: 2017-09-02 — End: ?
  Filled 2017-09-02: qty 5

## 2017-09-02 MED ORDER — ONDANSETRON HCL 4 MG/2ML IJ SOLN: 4.0000 mg | Freq: Four times a day (QID) | INTRAMUSCULAR | Status: DC | PRN

## 2017-09-02 MED ORDER — CALCIUM CARBONATE 1250 (500 CA) MG PO TABS
2.0000 | ORAL_TABLET | Freq: Three times a day (TID) | ORAL | Status: DC
  Filled 2017-09-02 (×3): qty 1
  Filled 2017-09-02: qty 2

## 2017-09-02 MED ORDER — KCL IN DEXTROSE-NACL 20-5-0.45 MEQ/L-%-% IV SOLN
INTRAVENOUS | Status: DC
  Administered 2017-09-02: 18:00:00 via INTRAVENOUS
  Filled 2017-09-02: qty 1000

## 2017-09-02 MED ORDER — MEPERIDINE HCL 50 MG PO TABS: 50.0000 mg | ORAL_TABLET | ORAL | Status: DC | PRN

## 2017-09-02 MED ORDER — MIDAZOLAM HCL 2 MG/2ML IJ SOLN
INTRAMUSCULAR | Status: AC
  Filled 2017-09-02: qty 2

## 2017-09-02 MED ORDER — ACETAMINOPHEN 325 MG PO TABS
650.0000 mg | ORAL_TABLET | Freq: Four times a day (QID) | ORAL | Status: DC | PRN
  Administered 2017-09-02 – 2017-09-03 (×3): 650 mg via ORAL
  Filled 2017-09-02 (×3): qty 2

## 2017-09-02 MED ORDER — PROPOFOL 10 MG/ML IV BOLUS
INTRAVENOUS | Status: DC | PRN
  Administered 2017-09-02: 200 mg via INTRAVENOUS

## 2017-09-02 SURGICAL SUPPLY — 35 items
ATTRACTOMAT 16X20 MAGNETIC DRP (DRAPES) ×2 IMPLANT
BLADE SURG 15 STRL LF DISP TIS (BLADE) ×1 IMPLANT
BLADE SURG 15 STRL SS (BLADE) ×2
CHLORAPREP W/TINT 26ML (MISCELLANEOUS) ×2 IMPLANT
CLIP VESOCCLUDE MED 6/CT (CLIP) ×4 IMPLANT
CLIP VESOCCLUDE SM WIDE 6/CT (CLIP) ×6 IMPLANT
COVER SURGICAL LIGHT HANDLE (MISCELLANEOUS) ×2 IMPLANT
DISSECTOR ROUND CHERRY 3/8 STR (MISCELLANEOUS) IMPLANT
DRAPE LAPAROTOMY T 98X78 PEDS (DRAPES) ×2 IMPLANT
ELECT PENCIL ROCKER SW 15FT (MISCELLANEOUS) ×2 IMPLANT
ELECT REM PT RETURN 15FT ADLT (MISCELLANEOUS) ×2 IMPLANT
GAUZE SPONGE 4X4 12PLY STRL (GAUZE/BANDAGES/DRESSINGS) ×2 IMPLANT
GAUZE SPONGE 4X4 16PLY XRAY LF (GAUZE/BANDAGES/DRESSINGS) ×3 IMPLANT
GLOVE SURG ORTHO 8.0 STRL STRW (GLOVE) ×2 IMPLANT
GOWN STRL REUS W/TWL XL LVL3 (GOWN DISPOSABLE) ×4 IMPLANT
HEMOSTAT SURGICEL 2X4 FIBR (HEMOSTASIS) IMPLANT
ILLUMINATOR WAVEGUIDE N/F (MISCELLANEOUS) ×1 IMPLANT
KIT BASIN OR (CUSTOM PROCEDURE TRAY) ×2 IMPLANT
LIGHT WAVEGUIDE WIDE FLAT (MISCELLANEOUS) IMPLANT
PACK BASIC VI WITH GOWN DISP (CUSTOM PROCEDURE TRAY) ×2 IMPLANT
POWDER SURGICEL 3.0 GRAM (HEMOSTASIS) ×2 IMPLANT
SHEARS HARMONIC 9CM CVD (BLADE) ×2 IMPLANT
STAPLER VISISTAT 35W (STAPLE) IMPLANT
STRIP CLOSURE SKIN 1/2X4 (GAUZE/BANDAGES/DRESSINGS) ×2 IMPLANT
SUT MNCRL AB 4-0 PS2 18 (SUTURE) ×2 IMPLANT
SUT SILK 2 0 (SUTURE)
SUT SILK 2-0 18XBRD TIE 12 (SUTURE) IMPLANT
SUT SILK 3 0 (SUTURE)
SUT SILK 3-0 18XBRD TIE 12 (SUTURE) IMPLANT
SUT VIC AB 3-0 SH 18 (SUTURE) ×4 IMPLANT
SYR BULB IRRIGATION 50ML (SYRINGE) ×2 IMPLANT
TAPE CLOTH SURG 4X10 WHT LF (GAUZE/BANDAGES/DRESSINGS) ×1 IMPLANT
TOWEL OR 17X26 10 PK STRL BLUE (TOWEL DISPOSABLE) ×2 IMPLANT
TOWEL OR NON WOVEN STRL DISP B (DISPOSABLE) ×2 IMPLANT
YANKAUER SUCT BULB TIP 10FT TU (MISCELLANEOUS) ×2 IMPLANT

## 2017-09-02 NOTE — Anesthesia Procedure Notes (Signed)
Procedure Name: Intubation Date/Time: 09/02/2017 7:35 AM Performed by: Anne Fu, CRNA Pre-anesthesia Checklist: Patient identified, Emergency Drugs available, Suction available, Patient being monitored and Timeout performed Patient Re-evaluated:Patient Re-evaluated prior to induction Oxygen Delivery Method: Circle system utilized Preoxygenation: Pre-oxygenation with 100% oxygen Induction Type: IV induction Ventilation: Mask ventilation without difficulty Laryngoscope Size: Mac and 4 Grade View: Grade I Tube type: Oral Tube size: 7.5 mm Number of attempts: 1 Airway Equipment and Method: Stylet Placement Confirmation: ETT inserted through vocal cords under direct vision,  positive ETCO2 and breath sounds checked- equal and bilateral Secured at: 21 cm Tube secured with: Tape Dental Injury: Teeth and Oropharynx as per pre-operative assessment

## 2017-09-02 NOTE — Progress Notes (Signed)
Patient was encouraged by RN to ambulate, patient refused and said that she didn't get her medication so she isn't going to walk.

## 2017-09-02 NOTE — Anesthesia Preprocedure Evaluation (Addendum)
Anesthesia Evaluation  Patient identified by MRN, date of birth, ID band Patient awake    Reviewed: Allergy & Precautions, NPO status , Patient's Chart, lab work & pertinent test results  Airway Mallampati: II  TM Distance: >3 FB     Dental   Pulmonary neg pulmonary ROS,    breath sounds clear to auscultation       Cardiovascular hypertension,  Rhythm:Regular Rate:Normal     Neuro/Psych  Headaches,    GI/Hepatic GERD  ,(+) Hepatitis -  Endo/Other  Hypothyroidism   Renal/GU      Musculoskeletal   Abdominal   Peds  Hematology   Anesthesia Other Findings   Reproductive/Obstetrics                             Anesthesia Physical Anesthesia Plan  ASA: III  Anesthesia Plan: General   Post-op Pain Management:    Induction: Intravenous  PONV Risk Score and Plan: 3 and Treatment may vary due to age or medical condition, Ondansetron, Dexamethasone and Midazolam  Airway Management Planned:   Additional Equipment:   Intra-op Plan:   Post-operative Plan: Possible Post-op intubation/ventilation  Informed Consent: I have reviewed the patients History and Physical, chart, labs and discussed the procedure including the risks, benefits and alternatives for the proposed anesthesia with the patient or authorized representative who has indicated his/her understanding and acceptance.   Dental advisory given  Plan Discussed with: Anesthesiologist and CRNA  Anesthesia Plan Comments:         Anesthesia Quick Evaluation

## 2017-09-02 NOTE — Transfer of Care (Signed)
Immediate Anesthesia Transfer of Care Note  Patient: Stephanie [name]  Procedure(s) Performed: Procedure(s): TOTAL THYROIDECTOMY (N/A)  Patient Location: PACU  Anesthesia Type:General  Level of Consciousness:  sedated, patient cooperative and responds to stimulation  Airway & Oxygen Therapy:Patient Spontanous Breathing and Patient connected to face mask oxgen  Post-op Assessment:  Report given to PACU RN and Post -op Vital signs reviewed and stable  Post vital signs:  Reviewed and stable  Last Vitals:  Vitals:   09/02/17 0610  BP: 136/70  Pulse: 67  Resp: 18  Temp: 36.8 C  SpO2: 701%    Complications: No apparent anesthesia complications

## 2017-09-02 NOTE — Op Note (Signed)
Procedure Note  Pre-operative Diagnosis:  Thyroid neoplasm of uncertain behavior, multiple thyroid nodules  Post-operative Diagnosis:  same  Surgeon:  Armandina Gemma, MD  Assistant:  none   Procedure:  Total thyroidectomy  Anesthesia:  General  Estimated Blood Loss:  minimal  Drains: none         Specimen: thyroid to pathology  Indications:  Patient is referred by Dr. 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 Patient had initially been noted with thyroid nodules on little over 2 years ago. She has been followed with sequential ultrasound scanning. There was interval enlargement of a nodule in the thyroid isthmus now measuring 3.7 cm in size. There were 2 dominant nodules in the right thyroid lobe and one in the left thyroid lobe. Biopsy of the dominant nodule in the isthmus was performed and showed atypia. Patient subsequently underwent molecular genetic testing which yielded a result of suspicious, giving her a risk of malignancy of at least 50%. Patient is now referred for consideration for thyroidectomy for definitive diagnosis and management.   Procedure Details: Procedure was done in OR #4 at the [facility].  The patient was brought to the operating room and placed in a supine position on the operating room table.  Following administration of general anesthesia, the patient was positioned and then prepped and draped in the usual aseptic fashion.  After ascertaining that an adequate level of anesthesia had been achieved, a Kocher incision was made with #15 blade.  Dissection was carried through subcutaneous tissues and platysma. Hemostasis was achieved with the electrocautery.  Skin flaps were elevated cephalad and caudad from the thyroid notch to the sternal notch.  The [place] self-retaining retractor was placed for exposure.  Strap muscles were incised in the midline and dissection was begun on  the left side.  Strap muscles were reflected laterally.  Left thyroid lobe was normal in size.  The left lobe was gently mobilized with blunt dissection.  Superior pole vessels were dissected out and divided individually between small and medium Ligaclips with the Harmonic scalpel.  The thyroid lobe was rolled anteriorly.  Branches of the inferior thyroid artery were divided between small Ligaclips with the Harmonic scalpel.  Inferior venous tributaries were divided between Ligaclips.  Both the superior and inferior parathyroid glands were identified and preserved on their vascular pedicles.  The recurrent laryngeal nerve was identified and preserved along its course.  The ligament of Gwenlyn Found was released with the electrocautery and the gland was mobilized onto the anterior trachea. Isthmus was mobilized across the midline.  There was a large dominant nodule in the isthmus.  Dry pack was placed in the left neck.  Next, the right thyroid lobe was gently mobilized with blunt dissection.  Right thyroid lobe was mildly enlarged with nodules.  Superior pole vessels were dissected out and divided between small and medium Ligaclips with the Harmonic scalpel.  Superior parathyroid was identified and preserved.  Inferior venous tributaries were divided between medium Ligaclips with the Harmonic scalpel.  The right thyroid lobe was rolled anteriorly and the branches of the inferior thyroid artery divided between small Ligaclips.  The right recurrent laryngeal nerve was identified and preserved along its course.  The ligament of Gwenlyn Found was released with the electrocautery.  The right thyroid lobe was mobilized onto the anterior trachea and the remainder of the thyroid was dissected off the anterior trachea and the thyroid was completely excised.  A  suture was used to mark the right lobe. The entire thyroid gland was submitted to pathology for review.  The neck was irrigated with warm saline.  Powdered Surgicel was placed  throughout the operative field.  Strap muscles were reapproximated in the midline with interrupted 3-0 Vicryl sutures.  Platysma was closed with interrupted 3-0 Vicryl sutures.  Skin was closed with a running 4-0 Monocryl subcuticular suture.  Wound was washed and dried and steri-strips were applied.  Dry gauze dressing was placed.  The patient was awakened from anesthesia and brought to the recovery room.  The patient tolerated the procedure well.   Armandina Gemma, MD [facility] Office: [phone]

## 2017-09-02 NOTE — Progress Notes (Signed)
Pt refused to take our calcium tablets and suspension.  Would only agree to take her own supplement, however she only has enough for one dose today.

## 2017-09-02 NOTE — Interval H&P Note (Signed)
History and Physical Interval Note:  09/02/2017 7:08 AM  Stephanie [name]  has presented today for surgery, with the diagnosis of thyroid neoplasm of uncertain behavior, multiple thyroid nodules.  The various methods of treatment have been discussed with the patient and family. After consideration of risks, benefits and other options for treatment, the patient has consented to    Procedure(s): TOTAL THYROIDECTOMY (N/A) as a surgical intervention .    The patient's history has been reviewed, patient examined, no change in status, stable for surgery.  I have reviewed the patient's chart and labs.  Questions were answered to the patient's satisfaction.    Armandina Gemma, [place] [facility] Office: [place]

## 2017-09-02 NOTE — Anesthesia Postprocedure Evaluation (Signed)
Anesthesia Post Note  Patient: Stephanie [name]  Procedure(s) Performed: TOTAL THYROIDECTOMY (N/A Neck)     Patient location during evaluation: PACU Anesthesia Type: General Level of consciousness: awake Pain management: pain level controlled Vital Signs Assessment: post-procedure vital signs reviewed and stable Respiratory status: spontaneous breathing Cardiovascular status: stable Anesthetic complications: no    Last Vitals:  Vitals:   09/02/17 1145 09/02/17 1245  BP: (!) 148/98 (!) 149/98  Pulse: 60 66  Resp: 17 18  Temp: 36.7 C 36.7 C  SpO2: 97% 96%    Last Pain:  Vitals:   09/02/17 1245  TempSrc: Oral  PainSc:                  Jareth Pardee

## 2017-09-03 ENCOUNTER — Encounter (HOSPITAL_COMMUNITY): Payer: Self-pay | Admitting: Surgery

## 2017-09-03 DIAGNOSIS — C73 Malignant neoplasm of thyroid gland: Secondary | ICD-10-CM | POA: Diagnosis not present

## 2017-09-03 LAB — BASIC METABOLIC PANEL
Anion gap: 8 (ref 5–15)
BUN: 11 mg/dL (ref 6–20)
CO2: 27 mmol/L (ref 22–32)
Calcium: 9.1 mg/dL (ref 8.9–10.3)
Chloride: 105 mmol/L (ref 101–111)
Creatinine, Ser: 0.52 mg/dL (ref 0.44–1.00)
GFR calc Af Amer: >60 mL/min (ref >60)
GFR calc non Af Amer: >60 mL/min (ref >60)
Glucose, Bld: 104 mg/dL — ABNORMAL HIGH (ref 65–99)
Potassium: 4 mmol/L (ref 3.5–5.1)
Sodium: 140 mmol/L (ref 135–145)

## 2017-09-03 MED ORDER — MEPERIDINE HCL 50 MG PO TABS: 50.0000 mg | ORAL_TABLET | Freq: Four times a day (QID) | ORAL | 0 refills | Status: AC | PRN

## 2017-09-03 MED FILL — MEPERIDINE 50 MG TABLET: 50 | 3 days supply | Qty: 15 | Fill #0

## 2017-09-03 NOTE — Discharge Summary (Signed)
Physician Discharge Summary [facility]  Patient ID: Stephanie [name] MRN: [id number] DOB/AGE: 07/07/1960 57 y.o.  Admit date: 09/02/2017 Discharge date: 09/03/2017  Admission Diagnoses:  Thyroid neoplasm of uncertain behavior, multiple thyroid nodules  Discharge Diagnoses:  Principal Problem:   Neoplasm of uncertain behavior of thyroid gland Active Problems:   Multiple thyroid nodules   Discharged Condition: good  Hospital Course: Patient was admitted for observation following thyroid surgery.  Post op course was uncomplicated.  Pain was well controlled.  Tolerated diet.  Post op calcium level on morning following surgery was 9.1 mg/dl.  Patient was prepared for discharge home on POD#1.  Consults: None  Treatments: surgery: total thyroidectomy  Discharge Exam: Blood pressure 135/72, pulse 60, temperature 98.4 F (36.9 C), temperature source Oral, resp. rate 18, last menstrual period 08/26/2017, SpO2 96 %. HEENT - clear Neck - wound dry and intact; mild STS; voice normal Chest - clear bilaterally Cor - RRR   Disposition: Home  Discharge Instructions    Apply dressing   Complete by:  As directed    Apply light gauze dressing to neck wound before discharge home today.   Diet - low sodium heart healthy   Complete by:  As directed    Discharge instructions   Complete by:  As directed    [facility]  THYROID & PARATHYROID SURGERY:  POST-OP INSTRUCTIONS  Always review your discharge instruction sheet from the facility where your surgery was performed.  A prescription for pain medication may be given to you upon discharge.  Take your pain medication as prescribed.  If narcotic pain medicine is not needed, then you may take acetaminophen (Tylenol) or ibuprofen (Advil) as needed.  Take your usually prescribed medications unless otherwise directed.  If you need a refill on your pain medication, please contact our office during regular  business hours.  Prescriptions cannot be processed by our office after 5 pm or on weekends.  Start with a light diet upon arrival home, such as soup and crackers or toast.  Be sure to drink plenty of fluids daily.  Resume your normal diet the day after surgery.  Most patients will experience some swelling and bruising on the chest and neck area.  Ice packs will help.  Swelling and bruising can take several days to resolve.   It is common to experience some constipation after surgery.  Increasing fluid intake and taking a stool softener (Colace) will usually help or prevent this problem.  A mild laxative (Milk of Magnesia or Miralax) should be taken according to package directions if there has been no bowel movement after 48 hours.  You have steri-strips and a gauze dressing over your incision.  You may remove the gauze bandage on the second day after surgery, and you may shower at that time.  Leave your steri-strips (small skin tapes) in place directly over the incision.  These strips should remain on the skin for 5-7 days and then be removed.  You may get them wet in the shower and pat them dry.  You may resume regular (light) daily activities beginning the next day (such as daily self-care, walking, climbing stairs) gradually increasing activities as tolerated.  You may have sexual intercourse when it is comfortable.  Refrain from any heavy lifting or straining until approved by your doctor.  You may drive when you no longer are taking prescription pain medication, you can comfortably wear a seatbelt, and you can safely maneuver your car  and apply brakes.  You should see your doctor in the office for a follow-up appointment approximately three weeks after your surgery.  Make sure that you call for this appointment within a day or two after you arrive home to insure a convenient appointment time.  WHEN TO CALL YOUR DOCTOR: -- Fever greater than 101.5 -- Inability to urinate -- Nausea and/or  vomiting - persistent -- Extreme swelling or bruising -- Continued bleeding from incision -- Increased pain, redness, or drainage from the incision -- Difficulty swallowing or breathing -- Muscle cramping or spasms -- Numbness or tingling in hands or around lips  The clinic staff is available to answer your questions during regular business hours.  Please don't hesitate to call and ask to speak to one of the nurses if you have concerns.  Armandina Gemma, MD [facility] Office: [phone]   Ice pack   Complete by:  As directed    Increase activity slowly   Complete by:  As directed    Remove dressing in 24 hours   Complete by:  As directed      Allergies as of 09/03/2017      Reactions   Morphine And Related Other (See Comments)   headache   Nsaids Other (See Comments)   Bariatric Patient       Medication List    TAKE these medications   benazepril 20 MG tablet Commonly known as:  LOTENSIN Take 1 tablet (20 mg total) by mouth daily.   CALCIUM PO Take 1,000 mg by mouth daily.   meperidine 50 MG tablet Commonly known as:  DEMEROL Take 1 tablet (50 mg total) by mouth every 6 (six) hours as needed for moderate pain.   multivitamin with minerals Tabs tablet Take 1 tablet by mouth daily.   SYNTHROID 75 MCG tablet Generic drug:  levothyroxine Take 75 mcg by mouth daily before breakfast.        Earnstine Regal, MD, [facility] Office: [phone]   Signed: Earnstine Regal 09/03/2017, 8:19 AM

## 2017-09-03 NOTE — Progress Notes (Signed)
Pt alert and oriented.  Tolerating diet.  D/C instructions and prescription was given, all questions answered.

## 2017-09-08 ENCOUNTER — Other Ambulatory Visit: Payer: Self-pay | Admitting: Internal Medicine

## 2017-09-08 DIAGNOSIS — C73 Malignant neoplasm of thyroid gland: Secondary | ICD-10-CM

## 2017-10-08 ENCOUNTER — Other Ambulatory Visit: Payer: 59

## 2017-10-09 ENCOUNTER — Ambulatory Visit
Admission: RE | Admit: 2017-10-09 | Discharge: 2017-10-09 | Disposition: A | Discharge status: Home / Self Care | Payer: 59 | Source: Ambulatory Visit | Attending: Internal Medicine | Admitting: Internal Medicine

## 2017-10-09 DIAGNOSIS — C73 Malignant neoplasm of thyroid gland: Secondary | ICD-10-CM

## 2017-10-16 ENCOUNTER — Ambulatory Visit
Admission: RE | Admit: 2017-10-16 | Discharge: 2017-10-16 | Disposition: A | Discharge status: Home / Self Care | Payer: 59 | Source: Ambulatory Visit | Attending: Internal Medicine | Admitting: Internal Medicine

## 2017-10-16 ENCOUNTER — Other Ambulatory Visit: Payer: Self-pay | Admitting: Internal Medicine

## 2017-10-16 DIAGNOSIS — Z1231 Encounter for screening mammogram for malignant neoplasm of breast: Secondary | ICD-10-CM

## 2017-10-16 DIAGNOSIS — E89822 Postprocedural seroma of an endocrine system organ or structure following an endocrine system procedure: Secondary | ICD-10-CM

## 2017-10-16 DIAGNOSIS — C73 Malignant neoplasm of thyroid gland: Secondary | ICD-10-CM

## 2017-11-07 ENCOUNTER — Other Ambulatory Visit (HOSPITAL_COMMUNITY): Payer: Self-pay | Admitting: Internal Medicine

## 2017-11-07 DIAGNOSIS — C73 Malignant neoplasm of thyroid gland: Secondary | ICD-10-CM

## 2017-11-18 ENCOUNTER — Encounter (HOSPITAL_COMMUNITY)
Admission: RE | Admit: 2017-11-18 | Discharge: 2017-11-18 | Disposition: A | Discharge status: Home / Self Care | Payer: 59 | Source: Ambulatory Visit | Attending: Internal Medicine | Admitting: Internal Medicine

## 2017-11-18 ENCOUNTER — Encounter (HOSPITAL_COMMUNITY): Payer: Self-pay

## 2017-11-18 DIAGNOSIS — C73 Malignant neoplasm of thyroid gland: Secondary | ICD-10-CM | POA: Insufficient documentation

## 2017-11-18 MED ORDER — THYROTROPIN ALFA 1.1 MG IM SOLR
0.9000 mg | INTRAMUSCULAR | Status: AC
  Administered 2017-11-18: 0.9 mg via INTRAMUSCULAR

## 2017-11-19 ENCOUNTER — Encounter (HOSPITAL_COMMUNITY)
Admission: RE | Admit: 2017-11-19 | Discharge: 2017-11-19 | Disposition: A | Discharge status: Home / Self Care | Payer: 59 | Source: Ambulatory Visit | Attending: Internal Medicine | Admitting: Internal Medicine

## 2017-11-19 DIAGNOSIS — C73 Malignant neoplasm of thyroid gland: Secondary | ICD-10-CM | POA: Diagnosis not present

## 2017-11-19 MED ORDER — STERILE WATER FOR INJECTION IJ SOLN
INTRAMUSCULAR | Status: AC
  Filled 2017-11-19: qty 10

## 2017-11-19 MED ORDER — THYROTROPIN ALFA 1.1 MG IM SOLR
0.9000 mg | INTRAMUSCULAR | Status: AC
  Administered 2017-11-19: 0.9 mg via INTRAMUSCULAR

## 2017-11-20 ENCOUNTER — Encounter (HOSPITAL_COMMUNITY)
Admission: RE | Admit: 2017-11-20 | Discharge: 2017-11-20 | Disposition: A | Discharge status: Home / Self Care | Payer: 59 | Source: Ambulatory Visit | Attending: Internal Medicine | Admitting: Internal Medicine

## 2017-11-20 DIAGNOSIS — C73 Malignant neoplasm of thyroid gland: Secondary | ICD-10-CM | POA: Diagnosis not present

## 2017-11-20 MED ORDER — SODIUM IODIDE I 131 CAPSULE
49.2000 | Freq: Once | INTRAVENOUS | Status: AC | PRN
  Administered 2017-11-20: 49.2 via ORAL

## 2017-11-28 ENCOUNTER — Encounter (HOSPITAL_COMMUNITY)
Admission: RE | Admit: 2017-11-28 | Discharge: 2017-11-28 | Disposition: A | Discharge status: Home / Self Care | Payer: 59 | Source: Ambulatory Visit | Attending: Internal Medicine | Admitting: Internal Medicine

## 2017-11-28 DIAGNOSIS — C73 Malignant neoplasm of thyroid gland: Secondary | ICD-10-CM | POA: Insufficient documentation

## 2018-01-20 ENCOUNTER — Other Ambulatory Visit: Payer: Self-pay | Admitting: Internal Medicine

## 2018-01-20 ENCOUNTER — Ambulatory Visit
Admission: RE | Admit: 2018-01-20 | Discharge: 2018-01-20 | Disposition: A | Discharge status: Home / Self Care | Payer: 59 | Source: Ambulatory Visit | Attending: Internal Medicine | Admitting: Internal Medicine

## 2018-01-20 DIAGNOSIS — C73 Malignant neoplasm of thyroid gland: Secondary | ICD-10-CM

## 2018-01-20 DIAGNOSIS — E89822 Postprocedural seroma of an endocrine system organ or structure following an endocrine system procedure: Secondary | ICD-10-CM

## 2018-07-01 DIAGNOSIS — E039 Hypothyroidism, unspecified: Secondary | ICD-10-CM | POA: Diagnosis not present

## 2018-07-01 DIAGNOSIS — Z85850 Personal history of malignant neoplasm of thyroid: Secondary | ICD-10-CM | POA: Diagnosis not present

## 2018-08-07 DIAGNOSIS — E039 Hypothyroidism, unspecified: Secondary | ICD-10-CM | POA: Diagnosis not present

## 2018-10-23 DIAGNOSIS — E039 Hypothyroidism, unspecified: Secondary | ICD-10-CM | POA: Diagnosis not present

## 2018-11-04 ENCOUNTER — Encounter: Payer: Self-pay | Admitting: Gynecology

## 2018-11-04 ENCOUNTER — Other Ambulatory Visit: Payer: Self-pay

## 2018-11-04 ENCOUNTER — Ambulatory Visit: Payer: BC Managed Care – PPO | Admitting: Gynecology

## 2018-11-04 VITALS — BP 126/80 | Ht 65.0 in | Wt 232.0 lb

## 2018-11-04 DIAGNOSIS — N926 Irregular menstruation, unspecified: Secondary | ICD-10-CM | POA: Diagnosis not present

## 2018-11-04 MED ORDER — MEGESTROL ACETATE 20 MG PO TABS: 20.0000 mg | ORAL_TABLET | Freq: Every day | ORAL | 1 refills | Status: DC

## 2018-11-04 NOTE — Patient Instructions (Signed)
Follow-up for the ultrasound as scheduled. 

## 2018-11-04 NOTE — Progress Notes (Signed)
    Stephanie [name] Oct 30, 1960 [id number]        58 y.o.  G1P0001 new patient who presents complaining of menorrhagia.  Notes over the past several years her periods have become more irregular where she will have skips by several months.  Over this past year her last period was 4 months ago and 6 months or so preceding that.  She started bleeding 10/27/2018 and this morning was passing large clots with bleedthrough episodes.  Also having cramping with this.  She relates a history of leiomyoma in the past.  Also a history of PCOS with some irregularity in her cycles although she had been on oral contraceptives to regulate.    Past medical history,surgical history, problem list, medications, allergies, family history and social history were all reviewed and documented as reviewed in the EPIC chart.  ROS:  Performed with pertinent positives and negatives included in the history, assessment and plan.   Additional significant findings : None   Exam: Caryn Bee assistant Vitals:   11/04/18 1036  BP: 126/80  Weight: 232 lb (105.2 kg)  Height: 5\' 5"  (1.651 m)   Body mass index is 38.61 kg/m.  General appearance:  Normal affect, orientation and appearance. Skin: Grossly normal HEENT: Without gross lesions.  No cervical or supraclavicular adenopathy. Thyroid normal.  Lungs:  Clear without wheezing, rales or rhonchi Cardiac: RR, without RMG Abdominal:  Soft, nontender, without masses, guarding, rebound, organomegaly or hernia Breasts:  Examined lying and sitting without masses, retractions, discharge or axillary adenopathy. Pelvic:  Ext, BUS, Vagina: With atrophic changes.  Light menstrual flow.  Cervix: High in the vaginal vault grossly normal.  Uterus: Difficult to palpate but no gross masses or tenderness  Adnexa: Without masses or tenderness    Anus and perineum: Normal   Rectovaginal: Normal sphincter tone without palpated masses or tenderness.    Assessment/Plan:  58 y.o. G69P0001  female with:  1. Irregular menses over the past several years.  To menses over the past year with the last one 4 months ago.  Now with heavy bleeding although it seems to be resolving.  History of prior PCOS and leiomyoma.  Discussed differential to include perimenopausal irregularity with bleeding, structural such as polyps and myomas, hyperplastic changes and uterine cancer.  Will check baseline CBC given her history of heavy bleeding and FSH to see will we stand from a menopausal standpoint.  She reports recently having a normal thyroid screen as she is on thyroid replacement at her primary physician's office.  We will schedule sonohysterogram for pelvic surveillance, check on the leiomyoma and endometrial assessment.  She will schedule in follow-up for this.  In the interim we will start Megace 20 mg twice daily for several days and then daily through her ultrasound to prevent heavy irregular bleeding.  Her last Pap smear/HPV was normal 2017.  No history of abnormal Pap smears previously.  Recommended repeating the Pap smear/HPV at 5-year interval per current screening guidelines.  She is in the process of arranging mammography now.  Her breast exam was normal today.   Anastasio Auerbach MD, 11:32 AM 11/04/2018

## 2018-11-05 ENCOUNTER — Encounter: Payer: Self-pay | Admitting: Gynecology

## 2018-11-05 LAB — CBC WITH DIFFERENTIAL/PLATELET
Absolute Monocytes: 673 cells/uL (ref 200–950)
Basophils Absolute: 40 cells/uL (ref 0–200)
Basophils Relative: 0.4 %
Eosinophils Absolute: 89 cells/uL (ref 15–500)
Eosinophils Relative: 0.9 %
HCT: 38.9 % (ref 35.0–45.0)
Hemoglobin: 13.6 g/dL (ref 11.7–15.5)
Lymphs Abs: 2752 cells/uL (ref 850–3900)
MCH: 32.2 pg (ref 27.0–33.0)
MCHC: 35 g/dL (ref 32.0–36.0)
MCV: 92.2 fL (ref 80.0–100.0)
MPV: 10.4 fL (ref 7.5–12.5)
Monocytes Relative: 6.8 %
Neutro Abs: 6346 cells/uL (ref 1500–7800)
Neutrophils Relative %: 64.1 %
Platelets: 321 10*3/uL (ref 140–400)
RBC: 4.22 10*6/uL (ref 3.80–5.10)
RDW: 13.1 % (ref 11.0–15.0)
Total Lymphocyte: 27.8 %
WBC: 9.9 10*3/uL (ref 3.8–10.8)

## 2018-11-05 LAB — FOLLICLE STIMULATING HORMONE: FSH: 16.2 m[IU]/mL

## 2018-11-26 ENCOUNTER — Other Ambulatory Visit: Payer: Self-pay

## 2018-11-26 ENCOUNTER — Ambulatory Visit (INDEPENDENT_AMBULATORY_CARE_PROVIDER_SITE_OTHER): Payer: BC Managed Care – PPO

## 2018-11-26 ENCOUNTER — Ambulatory Visit: Payer: BC Managed Care – PPO | Admitting: Gynecology

## 2018-11-26 ENCOUNTER — Encounter: Payer: Self-pay | Admitting: Gynecology

## 2018-11-26 ENCOUNTER — Other Ambulatory Visit: Payer: Self-pay | Admitting: Gynecology

## 2018-11-26 VITALS — BP 120/80

## 2018-11-26 DIAGNOSIS — N852 Hypertrophy of uterus: Secondary | ICD-10-CM | POA: Diagnosis not present

## 2018-11-26 DIAGNOSIS — N840 Polyp of corpus uteri: Secondary | ICD-10-CM

## 2018-11-26 DIAGNOSIS — D219 Benign neoplasm of connective and other soft tissue, unspecified: Secondary | ICD-10-CM | POA: Diagnosis not present

## 2018-11-26 DIAGNOSIS — N926 Irregular menstruation, unspecified: Secondary | ICD-10-CM

## 2018-11-26 NOTE — Progress Notes (Signed)
    Stephanie [name] 08/26/1960 [id number]        58 y.o.  G1P0001 presents for sonohysterogram.  History of irregular menses over the past year where she has had 2 bleeds in the past year.  She started bleeding the end of May and had very heavy bleeding with passage of clots.  She does have a history of leiomyoma status post abdominal past.  Also with PCOS and irregularity of her cycles that she had used oral contraceptives to regulate.  Her exam was negative although her uterus was difficult to palpate.  Her hemoglobin was 13.6 and her FSH was 16.  Past medical history,surgical history, problem list, medications, allergies, family history and social history were all reviewed and documented in the EPIC chart.  Directed ROS with pertinent positives and negatives documented in the history of present illness/assessment and plan.  Exam: Ivin Booty assistant General appearance:  Normal Abdomen soft nontender without masses guarding rebound Pelvic external BUS vagina normal.  Cervix normal high in vaginal vault.  Uterus difficult to palpate.  No gross masses or tenderness on bimanual.  Ultrasound transvaginal and transabdominal shows uterus enlarged with 6.5 x 5.6 cm myoma pedunculated to the left.  Endometrial echo thickened at 36.9 mm.  Right ovary normal.  Left ovary not visualized due to the myoma.  Sonohysterogram performed, sterile technique, single-tooth tenaculum required for anterior lip stabilization, subsequent easy catheter introduction, good distention with large endometrial polypoid mass measuring 4.2 cm.  Second area measuring 2.6 cm may represent a separate mass versus part of the first mass.  Endometrial biopsy taken.  Patient tolerated well.  Assessment/Plan:  58 y.o. G1P0001 with history of irregular menses over the past year and recent heavy bleeding with passage of clots.  Hemoglobin good.  Crete 16.  Ultrasound shows myoma and a large endometrial mass with polyp echogenicity versus  possible myoma.  Discussed situation with the patient with recommendations to proceed with hysteroscopy D&C with resection of the endometrial mass.  We discussed was involved with the procedure and the expected intraoperative and postoperative courses.  We will call her with the biopsy results from the ultrasound and move towards scheduling the hysteroscopy D&C.  I discussed after removing the endometrial mass will see what her menses do and may require more regular progesterone withdrawal to prevent endometrial buildup and heavy bleeding.   Anastasio Auerbach MD, 4:51 PM 11/26/2018

## 2018-11-26 NOTE — Patient Instructions (Addendum)
Office will call you with biopsy results.  Office will call you to schedule the hysteroscopy D&C

## 2018-11-26 NOTE — Addendum Note (Signed)
Addended by: Nelva Nay on: 11/26/2018 05:16 PM   Modules accepted: Orders

## 2018-11-27 ENCOUNTER — Telehealth: Payer: Self-pay

## 2018-11-27 NOTE — Telephone Encounter (Signed)
I called patient to discuss scheduling surgery. I let patient know I am waiting on the August schedule as that is our next available and she is fine with that.  I discussed her ins benefits with her and her estimated surgery prepymt due by one week prior to surgery.  She knows I will be in touch as soon as I get the August calendar.

## 2018-11-30 ENCOUNTER — Encounter: Payer: Self-pay | Admitting: Gynecology

## 2018-12-02 ENCOUNTER — Other Ambulatory Visit: Payer: Self-pay | Admitting: Gynecology

## 2018-12-02 ENCOUNTER — Telehealth: Payer: Self-pay

## 2018-12-02 MED ORDER — MISOPROSTOL 100 MCG PO TABS: ORAL_TABLET | ORAL | 0 refills | Status: DC

## 2018-12-02 NOTE — Telephone Encounter (Signed)
Spoke with patient about scheduling surgery. Discussed her ins benefits with her and her estimated surgery prepymt due by one week before surgery.  Scheduled her for 01/05/19 at 9:00am at [facility]. He Covid Screen was scheduled as well and I will mail her a sheet about that, a financial letter and a [facility] pamphlet.  I told her about the need for a Cytotec tab vaginally hs before surgery. Warned her she might see spotting/bleeding and/or cramping the night she uses it. No worries, just means it is working. Rx sent.

## 2018-12-08 ENCOUNTER — Encounter: Payer: Self-pay | Admitting: Gynecology

## 2018-12-17 ENCOUNTER — Other Ambulatory Visit: Payer: Self-pay

## 2018-12-17 NOTE — Telephone Encounter (Signed)
Surgery is August 4th.

## 2018-12-18 MED ORDER — MEGESTROL ACETATE 20 MG PO TABS: 20.0000 mg | ORAL_TABLET | Freq: Every day | ORAL | 1 refills | Status: DC

## 2018-12-29 ENCOUNTER — Other Ambulatory Visit: Payer: Self-pay

## 2018-12-30 ENCOUNTER — Ambulatory Visit: Payer: BC Managed Care – PPO | Admitting: Gynecology

## 2018-12-30 ENCOUNTER — Encounter: Payer: Self-pay | Admitting: Gynecology

## 2018-12-30 VITALS — BP 118/80

## 2018-12-30 DIAGNOSIS — N840 Polyp of corpus uteri: Secondary | ICD-10-CM

## 2018-12-30 DIAGNOSIS — N926 Irregular menstruation, unspecified: Secondary | ICD-10-CM | POA: Diagnosis not present

## 2018-12-30 NOTE — Progress Notes (Signed)
    Stephanie [name] [birth year] [id number]        58 y.o.  G1P0001 presents for her preop appointment upcoming hysteroscopy D&C with resection of endometrial polyp.  History of irregular menses with PCOS for the past several years.  Had 2 episodes of bleeding the past year and then started bleeding on and off for the past 2 months.  Hemoglobin 13.6.  FSH was 16.  Ultrasound showed 6.5 cm myoma pedunculated on the left.  Endometrial echo thickened at 36.9 mm.  Right ovary normal left ovary obscured by the myoma.  Sonohysterogram showed large endometrial polypoid mass measuring 4.2 cm.  Second area measuring 2.6 cm possibly representing separate mass versus continuation of the first.  Endometrial biopsy did show benign endometrial polyp.  Past medical history,surgical history, problem list, medications, allergies, family history and social history were all reviewed and documented in the EPIC chart.  Directed ROS with pertinent positives and negatives documented in the history of present illness/assessment and plan.  Exam: Wandra Scot assistant Vitals:   12/30/18 1606  BP: 118/80   General appearance:  Normal HEENT normal Lungs clear Cardiac regular rate no rubs murmurs or gallops. Abdomen soft nontender without masses guarding rebound Pelvic external BUS vagina normal.  Cervix normal high in vaginal vault.  Uterus difficult to palpate but no gross masses or tenderness.  Adnexa without gross masses or tenderness.  Assessment/Plan:  58 y.o. G1P0001 with irregular bleeding and ultrasound showing large endometrial polyp.  Endometrial sample confirmed benign endometrial polyp.  For hysteroscopy D&C and resection of the polyp.  I reviewed the proposed surgery with the patient to include the expected intraoperative and postoperative courses as well as the recovery period. The use of the hysteroscope, resectoscope and the D&C portion were all discussed. The risks of surgery to include infection, prolonged  antibiotics, hemorrhage necessitating transfusion and the risks of transfusion, including transfusion reaction, hepatitis, HIV, mad cow disease and other unknown entities were all discussed understood and accepted. The risk of damage to internal organs during the procedure, either immediately recognized or delay recognized, including vagina, cervix, uterus, possible perforation causing damage to bowel, bladder, ureters, vessels and nerves necessitating major exploratory reparative surgery and future reparative surgeries including bladder repair, ureteral damage repair, bowel resection, ostomy formation was also discussed understood and accepted.  She understands that she is in the perimenopause and does have a history of PCOS with irregular periods.  There are no guarantees that this will alleviate her irregular bleeding and we will plan on monitoring her cycles after the procedure and then addressing as needed.  The patient's questions were answered to her satisfaction and she is ready to proceed with surgery.     Anastasio Auerbach MD, 4:34 PM 12/30/2018

## 2018-12-30 NOTE — Patient Instructions (Signed)
Followup for surgery as scheduled. 

## 2018-12-30 NOTE — H&P (Signed)
Stephanie [name] 22-Feb-1961 [id number]   History and Physical  Chief complaint: Irregular bleeding, endometrial polyp  History of present illness: 58 y.o. G1P0001 with a history of irregular menses with PCOS for the past several years.  Had 2 episodes of bleeding the past year and then started bleeding on and off for the past 2 months.  Hemoglobin 13.6.  FSH was 16.  Ultrasound showed 6.5 cm myoma pedunculated on the left.  Endometrial echo thickened at 36.9 mm.  Right ovary normal left ovary obscured by the myoma.  Sonohysterogram showed large endometrial polypoid mass measuring 4.2 cm.  Second area measuring 2.6 cm possibly representing separate mass versus continuation of the first.  Endometrial biopsy did show benign endometrial polyp.  Patient is admitted for hysteroscopy D&C and resection of the endometrial polyp.  Past Medical History:  Diagnosis Date  . Allergy   . Cholelithiasis and acute cholecystitis without obstruction 04/08/2013  . GERD (gastroesophageal reflux disease)   . Headache   . Herpes genitalia   . Hypertension   . Hypothyroidism   . Obesity   . Polycystic ovarian syndrome   . Thyroid disease     Past Surgical History:  Procedure Laterality Date  . BREAST REDUCTION SURGERY  1994  . CESAREAN SECTION  5/04  . CHOLECYSTECTOMY N/A 04/07/2013   Procedure: LAPAROSCOPIC CHOLECYSTECTOMY;  Surgeon: Adin Hector, MD;  Location: [facility];  Service: General;  Laterality: N/A;  . LAPAROSCOPIC GASTRIC SLEEVE RESECTION WITH HIATAL HERNIA REPAIR N/A 05/14/2016   Procedure: LAPAROSCOPIC GASTRIC SLEEVE RESECTION WITH POSSIBLE HIATAL HERNIA REPAIR, UPPER ENDO;  Surgeon: Johnathan Hausen, MD;  Location: [facility];  Service: General;  Laterality: N/A;  . LIVER BIOPSY N/A 04/07/2013   Procedure: LIVER BIOPSY;  Surgeon: Adin Hector, MD;  Location: [facility];  Service: General;  Laterality: N/A;  . MYOMECTOMY  11/2000  . REDUCTION MAMMAPLASTY    . THYROIDECTOMY N/A 09/02/2017   Procedure:  TOTAL THYROIDECTOMY;  Surgeon: Armandina Gemma, MD;  Location: [facility];  Service: General;  Laterality: N/A;    Family History  Problem Relation Age of Onset  . Cancer Mother        leukemia  . Hypertension Father   . Heart disease Maternal Grandmother   . Heart disease Maternal Grandfather   . Aneurysm Maternal Grandfather   . Aneurysm Paternal Grandmother     Social History:  reports that she has never smoked. She has never used smokeless tobacco. She reports previous alcohol use. She reports that she does not use drugs.  Allergies:  Allergies  Allergen Reactions  . Dilaudid [Hydromorphone]   . Fentanyl   . Morphine And Related Other (See Comments)    headache  . Nsaids Other (See Comments)    Bariatric Patient     Medications: See Epic for the most current list of medications.  ROS:  Was performed and pertinent positives and negatives are included in the history of present illness.  Exam:  Wandra Scot assistant Vitals:   12/30/18 1606  BP: 118/80   General appearance:  Normal HEENT normal Lungs clear Cardiac regular rate no rubs murmurs or gallops. Abdomen soft nontender without masses guarding rebound Pelvic external BUS vagina normal.  Cervix normal high in vaginal vault.  Uterus difficult to palpate but no gross masses or tenderness.  Adnexa without gross masses or tenderness.    Assessment/Plan:  58 y.o. G1P0001 with irregular bleeding and ultrasound showing large endometrial polyp.  Endometrial sample confirmed benign endometrial  polyp.  For hysteroscopy D&C and resection of the polyp.  I reviewed the proposed surgery with the patient to include the expected intraoperative and postoperative courses as well as the recovery period. The use of the hysteroscope, resectoscope and the D&C portion were all discussed. The risks of surgery to include infection, prolonged antibiotics, hemorrhage necessitating transfusion and the risks of transfusion, including transfusion  reaction, hepatitis, HIV, mad cow disease and other unknown entities were all discussed understood and accepted. The risk of damage to internal organs during the procedure, either immediately recognized or delay recognized, including vagina, cervix, uterus, possible perforation causing damage to bowel, bladder, ureters, vessels and nerves necessitating major exploratory reparative surgery and future reparative surgeries including bladder repair, ureteral damage repair, bowel resection, ostomy formation was also discussed understood and accepted.  She understands that she is in the perimenopause and does have a history of PCOS with irregular periods.  There are no guarantees that this will alleviate her irregular bleeding and we will plan on monitoring her cycles after the procedure and then addressing as needed.  The patient's questions were answered to her satisfaction and she is ready to proceed with surgery.    Anastasio Auerbach MD, 4:39 PM 12/30/2018

## 2019-01-01 ENCOUNTER — Encounter (HOSPITAL_BASED_OUTPATIENT_CLINIC_OR_DEPARTMENT_OTHER): Payer: Self-pay | Admitting: *Deleted

## 2019-01-01 ENCOUNTER — Other Ambulatory Visit: Payer: Self-pay

## 2019-01-01 ENCOUNTER — Other Ambulatory Visit (HOSPITAL_COMMUNITY)
Admission: RE | Admit: 2019-01-01 | Discharge: 2019-01-01 | Disposition: A | Discharge status: Home / Self Care | Payer: BC Managed Care – PPO | Source: Ambulatory Visit | Attending: Gynecology | Admitting: Gynecology

## 2019-01-01 ENCOUNTER — Encounter (HOSPITAL_COMMUNITY)
Admission: RE | Admit: 2019-01-01 | Discharge: 2019-01-01 | Disposition: A | Discharge status: Home / Self Care | Payer: BC Managed Care – PPO | Source: Ambulatory Visit | Attending: Gynecology | Admitting: Gynecology

## 2019-01-01 DIAGNOSIS — Z20828 Contact with and (suspected) exposure to other viral communicable diseases: Secondary | ICD-10-CM | POA: Insufficient documentation

## 2019-01-01 DIAGNOSIS — Z01818 Encounter for other preprocedural examination: Secondary | ICD-10-CM | POA: Insufficient documentation

## 2019-01-01 LAB — CBC
HCT: 37.9 % (ref 36.0–46.0)
Hemoglobin: 12 g/dL (ref 12.0–15.0)
MCH: 29.6 pg (ref 26.0–34.0)
MCHC: 31.7 g/dL (ref 30.0–36.0)
MCV: 93.6 fL (ref 80.0–100.0)
Platelets: 299 10*3/uL (ref 150–400)
RBC: 4.05 MIL/uL (ref 3.87–5.11)
RDW: 11.8 % (ref 11.5–15.5)
WBC: 7.2 10*3/uL (ref 4.0–10.5)
nRBC: 0 % (ref 0.0–0.2)

## 2019-01-01 LAB — COMPREHENSIVE METABOLIC PANEL
ALT: 11 U/L (ref 0–44)
AST: 13 U/L — ABNORMAL LOW (ref 15–41)
Albumin: 3.7 g/dL (ref 3.5–5.0)
Alkaline Phosphatase: 46 U/L (ref 38–126)
Anion gap: 7 (ref 5–15)
BUN: 16 mg/dL (ref 6–20)
CO2: 23 mmol/L (ref 22–32)
Calcium: 8.6 mg/dL — ABNORMAL LOW (ref 8.9–10.3)
Chloride: 112 mmol/L — ABNORMAL HIGH (ref 98–111)
Creatinine, Ser: 0.67 mg/dL (ref 0.44–1.00)
GFR calc Af Amer: >60 mL/min (ref >60)
GFR calc non Af Amer: >60 mL/min (ref >60)
Glucose, Bld: 107 mg/dL — ABNORMAL HIGH (ref 70–99)
Potassium: 3.2 mmol/L — ABNORMAL LOW (ref 3.5–5.1)
Sodium: 142 mmol/L (ref 135–145)
Total Bilirubin: 0.6 mg/dL (ref 0.3–1.2)
Total Protein: 6.5 g/dL (ref 6.5–8.1)

## 2019-01-01 LAB — SARS CORONAVIRUS 2 (TAT 6-24 HRS): SARS Coronavirus 2: NEGATIVE

## 2019-01-01 NOTE — Progress Notes (Signed)
Spoke w/ pt via phone for pre-op interview.  Npo after mn.  Arrive at 0700.  Current lab results and ekg in chart and epic.  Pt had covid test done today.  Will take synthroid am dos w/ sips of water.

## 2019-01-04 ENCOUNTER — Other Ambulatory Visit: Payer: Self-pay | Admitting: *Deleted

## 2019-01-04 MED ORDER — POTASSIUM CHLORIDE ER 20 MEQ PO TBCR: EXTENDED_RELEASE_TABLET | ORAL | 0 refills | Status: DC

## 2019-01-05 ENCOUNTER — Ambulatory Visit (HOSPITAL_BASED_OUTPATIENT_CLINIC_OR_DEPARTMENT_OTHER): Payer: BC Managed Care – PPO | Admitting: Anesthesiology

## 2019-01-05 ENCOUNTER — Ambulatory Visit (HOSPITAL_BASED_OUTPATIENT_CLINIC_OR_DEPARTMENT_OTHER)
Admission: RE | Admit: 2019-01-05 | Discharge: 2019-01-05 | Disposition: A | Discharge status: Home / Self Care | Payer: BC Managed Care – PPO | Attending: Gynecology | Admitting: Gynecology

## 2019-01-05 ENCOUNTER — Encounter (HOSPITAL_BASED_OUTPATIENT_CLINIC_OR_DEPARTMENT_OTHER): Payer: Self-pay

## 2019-01-05 ENCOUNTER — Other Ambulatory Visit: Payer: Self-pay

## 2019-01-05 ENCOUNTER — Ambulatory Visit (HOSPITAL_BASED_OUTPATIENT_CLINIC_OR_DEPARTMENT_OTHER): Payer: BC Managed Care – PPO | Admitting: Physician Assistant

## 2019-01-05 ENCOUNTER — Encounter (HOSPITAL_BASED_OUTPATIENT_CLINIC_OR_DEPARTMENT_OTHER)
Admission: RE | Disposition: A | Discharge status: Home / Self Care | Payer: Self-pay | Source: Home / Self Care | Attending: Gynecology

## 2019-01-05 DIAGNOSIS — Z9884 Bariatric surgery status: Secondary | ICD-10-CM | POA: Diagnosis not present

## 2019-01-05 DIAGNOSIS — E282 Polycystic ovarian syndrome: Secondary | ICD-10-CM | POA: Diagnosis not present

## 2019-01-05 DIAGNOSIS — I10 Essential (primary) hypertension: Secondary | ICD-10-CM | POA: Insufficient documentation

## 2019-01-05 DIAGNOSIS — N926 Irregular menstruation, unspecified: Secondary | ICD-10-CM | POA: Diagnosis not present

## 2019-01-05 DIAGNOSIS — K759 Inflammatory liver disease, unspecified: Secondary | ICD-10-CM | POA: Diagnosis not present

## 2019-01-05 DIAGNOSIS — Z886 Allergy status to analgesic agent status: Secondary | ICD-10-CM | POA: Diagnosis not present

## 2019-01-05 DIAGNOSIS — M797 Fibromyalgia: Secondary | ICD-10-CM | POA: Diagnosis not present

## 2019-01-05 DIAGNOSIS — Z885 Allergy status to narcotic agent status: Secondary | ICD-10-CM | POA: Insufficient documentation

## 2019-01-05 DIAGNOSIS — Z6838 Body mass index (BMI) 38.0-38.9, adult: Secondary | ICD-10-CM | POA: Diagnosis not present

## 2019-01-05 DIAGNOSIS — Z1159 Encounter for screening for other viral diseases: Secondary | ICD-10-CM | POA: Diagnosis not present

## 2019-01-05 DIAGNOSIS — E039 Hypothyroidism, unspecified: Secondary | ICD-10-CM | POA: Diagnosis not present

## 2019-01-05 DIAGNOSIS — N840 Polyp of corpus uteri: Secondary | ICD-10-CM | POA: Diagnosis not present

## 2019-01-05 DIAGNOSIS — E669 Obesity, unspecified: Secondary | ICD-10-CM | POA: Diagnosis not present

## 2019-01-05 HISTORY — DX: Presence of spectacles and contact lenses: Z97.3

## 2019-01-05 HISTORY — DX: Personal history of malignant neoplasm of thyroid: Z85.850

## 2019-01-05 HISTORY — DX: Polyp of corpus uteri: N84.0

## 2019-01-05 HISTORY — DX: Nausea with vomiting, unspecified: R11.2

## 2019-01-05 HISTORY — DX: Personal history of other diseases of the digestive system: Z87.19

## 2019-01-05 HISTORY — DX: Other specified postprocedural states: Z98.890

## 2019-01-05 HISTORY — DX: Postprocedural hypothyroidism: E89.0

## 2019-01-05 HISTORY — DX: Leiomyoma of uterus, unspecified: D25.9

## 2019-01-05 HISTORY — DX: Irregular menstruation, unspecified: N92.6

## 2019-01-05 HISTORY — PX: DILATATION & CURETTAGE/HYSTEROSCOPY WITH MYOSURE: SHX6511

## 2019-01-05 HISTORY — DX: Other specified postprocedural states: R11.2

## 2019-01-05 LAB — POCT PREGNANCY, URINE: Preg Test, Ur: NEGATIVE

## 2019-01-05 SURGERY — DILATATION & CURETTAGE/HYSTEROSCOPY WITH MYOSURE
Anesthesia: General | Site: Uterus

## 2019-01-05 MED ORDER — ONDANSETRON HCL 4 MG/2ML IJ SOLN
INTRAMUSCULAR | Status: AC
  Filled 2019-01-05: qty 4

## 2019-01-05 MED ORDER — FENTANYL CITRATE (PF) 100 MCG/2ML IJ SOLN
INTRAMUSCULAR | Status: DC | PRN
  Administered 2019-01-05: 25 ug via INTRAVENOUS

## 2019-01-05 MED ORDER — MIDAZOLAM HCL 2 MG/2ML IJ SOLN
INTRAMUSCULAR | Status: DC | PRN
  Administered 2019-01-05: 2 mg via INTRAVENOUS

## 2019-01-05 MED ORDER — KETOROLAC TROMETHAMINE 30 MG/ML IJ SOLN
INTRAMUSCULAR | Status: AC
  Filled 2019-01-05: qty 1

## 2019-01-05 MED ORDER — PROPOFOL 10 MG/ML IV BOLUS
INTRAVENOUS | Status: DC | PRN
  Administered 2019-01-05: 200 mg via INTRAVENOUS

## 2019-01-05 MED ORDER — SODIUM CHLORIDE 0.9 % IR SOLN
Status: DC | PRN
  Administered 2019-01-05: 1

## 2019-01-05 MED ORDER — ARTIFICIAL TEARS OPHTHALMIC OINT
TOPICAL_OINTMENT | OPHTHALMIC | Status: AC
  Filled 2019-01-05: qty 3.5

## 2019-01-05 MED ORDER — LIDOCAINE 2% (20 MG/ML) 5 ML SYRINGE
INTRAMUSCULAR | Status: AC
  Filled 2019-01-05: qty 5

## 2019-01-05 MED ORDER — MIDAZOLAM HCL 2 MG/2ML IJ SOLN
INTRAMUSCULAR | Status: AC
  Filled 2019-01-05: qty 2

## 2019-01-05 MED ORDER — LIDOCAINE HCL 1 % IJ SOLN
INTRAMUSCULAR | Status: DC | PRN
  Administered 2019-01-05: 10 mL

## 2019-01-05 MED ORDER — LIDOCAINE 2% (20 MG/ML) 5 ML SYRINGE
INTRAMUSCULAR | Status: DC | PRN
  Administered 2019-01-05: 50 mg via INTRAVENOUS

## 2019-01-05 MED ORDER — ONDANSETRON HCL 4 MG/2ML IJ SOLN
INTRAMUSCULAR | Status: DC | PRN
  Administered 2019-01-05: 4 mg via INTRAVENOUS

## 2019-01-05 MED ORDER — SODIUM CHLORIDE 0.9 % IV SOLN
INTRAVENOUS | Status: AC
  Filled 2019-01-05: qty 2

## 2019-01-05 MED ORDER — PROMETHAZINE HCL 25 MG/ML IJ SOLN
6.2500 mg | INTRAMUSCULAR | Status: DC | PRN
  Filled 2019-01-05: qty 1

## 2019-01-05 MED ORDER — SODIUM CHLORIDE 0.9 % IV SOLN
2.0000 g | INTRAVENOUS | Status: AC
  Administered 2019-01-05: 2 g via INTRAVENOUS
  Filled 2019-01-05: qty 2

## 2019-01-05 MED ORDER — LIDOCAINE 2% (20 MG/ML) 5 ML SYRINGE
INTRAMUSCULAR | Status: AC
  Filled 2019-01-05: qty 20

## 2019-01-05 MED ORDER — LACTATED RINGERS IV SOLN
INTRAVENOUS | Status: DC
  Administered 2019-01-05: 1000 mL via INTRAVENOUS
  Filled 2019-01-05: qty 1000

## 2019-01-05 MED ORDER — ONDANSETRON HCL 4 MG/2ML IJ SOLN
INTRAMUSCULAR | Status: AC
  Filled 2019-01-05: qty 2

## 2019-01-05 MED ORDER — MEPERIDINE HCL 25 MG/ML IJ SOLN
6.2500 mg | INTRAMUSCULAR | Status: DC | PRN
  Filled 2019-01-05: qty 1

## 2019-01-05 MED ORDER — FENTANYL CITRATE (PF) 100 MCG/2ML IJ SOLN
INTRAMUSCULAR | Status: AC
  Filled 2019-01-05: qty 2

## 2019-01-05 MED ORDER — DEXAMETHASONE SODIUM PHOSPHATE 10 MG/ML IJ SOLN
INTRAMUSCULAR | Status: AC
  Filled 2019-01-05: qty 1

## 2019-01-05 MED ORDER — KETOROLAC TROMETHAMINE 30 MG/ML IJ SOLN
INTRAMUSCULAR | Status: DC | PRN
  Administered 2019-01-05: 30 mg via INTRAVENOUS

## 2019-01-05 MED ORDER — DEXAMETHASONE SODIUM PHOSPHATE 10 MG/ML IJ SOLN
INTRAMUSCULAR | Status: DC | PRN
  Administered 2019-01-05: 5 mg via INTRAVENOUS

## 2019-01-05 MED ORDER — DEXAMETHASONE SODIUM PHOSPHATE 10 MG/ML IJ SOLN
INTRAMUSCULAR | Status: AC
  Filled 2019-01-05: qty 2

## 2019-01-05 MED ORDER — KETOROLAC TROMETHAMINE 30 MG/ML IJ SOLN
INTRAMUSCULAR | Status: AC
  Filled 2019-01-05: qty 3

## 2019-01-05 MED ORDER — PROPOFOL 10 MG/ML IV BOLUS
INTRAVENOUS | Status: AC
  Filled 2019-01-05: qty 20

## 2019-01-05 SURGICAL SUPPLY — 20 items
CANISTER SUCT 3000ML PPV (MISCELLANEOUS) ×2 IMPLANT
CATH ROBINSON RED A/P 16FR (CATHETERS) ×2 IMPLANT
COVER WAND RF STERILE (DRAPES) ×2 IMPLANT
DEVICE MYOSURE LITE (MISCELLANEOUS) IMPLANT
DEVICE MYOSURE REACH (MISCELLANEOUS) ×1 IMPLANT
DILATOR CANAL MILEX (MISCELLANEOUS) ×1 IMPLANT
GAUZE 4X4 16PLY RFD (DISPOSABLE) ×2 IMPLANT
GLOVE BIO SURGEON STRL SZ7.5 (GLOVE) ×4 IMPLANT
GOWN STRL REUS W/TWL XL LVL3 (GOWN DISPOSABLE) ×2 IMPLANT
IV NS IRRIG 3000ML ARTHROMATIC (IV SOLUTION) ×2 IMPLANT
KIT PROCEDURE FLUENT (KITS) ×2 IMPLANT
KIT TURNOVER CYSTO (KITS) ×2 IMPLANT
MYOSURE XL FIBROID (MISCELLANEOUS)
PACK VAGINAL MINOR WOMEN LF (CUSTOM PROCEDURE TRAY) ×2 IMPLANT
PAD OB MATERNITY 4.3X12.25 (PERSONAL CARE ITEMS) ×2 IMPLANT
SEAL CERVICAL OMNI LOK (ABLATOR) ×1 IMPLANT
SEAL ROD LENS SCOPE MYOSURE (ABLATOR) ×2 IMPLANT
SYSTEM TISS REMOVAL MYOSURE XL (MISCELLANEOUS) IMPLANT
TOWEL OR 17X26 10 PK STRL BLUE (TOWEL DISPOSABLE) ×4 IMPLANT
WATER STERILE IRR 500ML POUR (IV SOLUTION) IMPLANT

## 2019-01-05 NOTE — Transfer of Care (Signed)
Immediate Anesthesia Transfer of Care Note  Patient: Stephanie [name]  Procedure(s) Performed: DILATATION & CURETTAGE/HYSTEROSCOPY WITH MYOSURE (N/A Uterus)  Patient Location: PACU  Anesthesia Type:General  Level of Consciousness: awake, alert , oriented and patient cooperative  Airway & Oxygen Therapy: Patient Spontanous Breathing and Patient connected to nasal cannula oxygen  Post-op Assessment: Report given to RN and Post -op Vital signs reviewed and stable  Post vital signs: Reviewed and stable  Last Vitals:  Vitals Value Taken Time  BP    Temp    Pulse 65 01/05/19 1008  Resp    SpO2 100 % 01/05/19 1008  Vitals shown include unvalidated device data.  Last Pain:  Vitals:   01/05/19 0745  TempSrc:   PainSc: 2       Patients Stated Pain Goal: 6 (42/35/36 1443)  Complications: No apparent anesthesia complications

## 2019-01-05 NOTE — Anesthesia Procedure Notes (Signed)
Procedure Name: LMA Insertion Date/Time: 01/05/2019 9:25 AM Performed by: Wanita Chamberlain, CRNA Pre-anesthesia Checklist: Timeout performed, Patient identified, Emergency Drugs available, Suction available and Patient being monitored Patient Re-evaluated:Patient Re-evaluated prior to induction Oxygen Delivery Method: Circle system utilized Preoxygenation: Pre-oxygenation with 100% oxygen Induction Type: IV induction Ventilation: Mask ventilation without difficulty LMA: LMA inserted LMA Size: 4.0 Number of attempts: 1 Placement Confirmation: breath sounds checked- equal and bilateral and positive ETCO2 Tube secured with: Tape Dental Injury: Teeth and Oropharynx as per pre-operative assessment

## 2019-01-05 NOTE — H&P (Signed)
The patient was examined.  I reviewed the proposed surgery and consent form with the patient.  The dictated history and physical is current and accurate and all questions were answered. The patient is ready to proceed with surgery and has a realistic understanding and expectation for the outcome.   Anastasio Auerbach MD, 8:45 AM 01/05/2019

## 2019-01-05 NOTE — Op Note (Signed)
JASMYNN PFALZGRAF [birth year] [id number]   Post Operative Note   Date of surgery:  01/05/2019  Pre Op Dx: Irregular bleeding, endometrial polyp, leiomyoma  Post Op Dx: Irregular bleeding, endometrial polyp, leiomyoma  Procedure: Hysteroscopy, D&C, MyoSure resection endometrial polyp  Surgeon:  Belinda Block Fontaine  Anesthesia:  General  EBL: 5 cc  Distended media discrepancy: 350 cc machine reported, large spillage noted on fluoroscopy.  Complications:  None  Specimen: #1 endometrial polyp #2 endometrial curetting to pathology  Findings: EUA: External BUS vagina with atrophic changes.  Cervix with atrophic changes.  Uterus bulky midline mobile.  Adnexa without gross masses    Hysteroscopy: Adequate noting fundus, right/left tubal ostia, anterior/posterior endometrial surfaces, lower uterine segment and endocervical canal visualized.  Small endometrial polyp inferior to the right tubal ostia lateral endometrial surface.  Larger left lateral endometrial polyp inferior to the left tubal ostia.  Both resected to the level of the surrounding endometrium.  Remainder of the endometrium appeared atrophic in appearance.  Procedure:  The patient was taken to the operating room, was placed in the low dorsal lithotomy position, underwent general anesthesia, received a perineal/vaginal preparation and the bladder was emptied with an in and out Foley catheterization. The timeout was performed by the surgical team. An EUA was performed. The patient was draped in the usual fashion. The cervix was visualized with a speculum, anterior lip grasped with a single-tooth tenaculum and a paracervical block was placed using 10 cc's of 1% lidocaine. The cervix was gently dilated to admit the Myosure hysteroscope and hysteroscopy was performed with findings noted above. Using the Myosure Reach resectoscopic wand the polyps were resected in their entirety to the level the surrounding endometrium. A gentle sharp curettage  was performed. Both specimens were sent separately to pathology.  Repeat hysteroscopy showed an empty cavity with good distention and no evidence of perforation. The instruments were removed and adequate hemostasis was visualized at the tenaculum site and external cervical os.  The specimens were identified for pathology.  The sponge, needle and instrument count were verified correct.  The patient was wanded per protocol.  The patient was given intraoperative Toradol, was awakened without difficulty and was taken to the recovery room in good condition having tolerated the procedure well.   Anastasio Auerbach MD, 10:17 AM 01/05/2019

## 2019-01-05 NOTE — Discharge Instructions (Signed)
°  Post Anesthesia Home Care Instructions  Activity: Get plenty of rest for the remainder of the day. A responsible adult should stay with you for 24 hours following the procedure.  For the next 24 hours, DO NOT: -Drive a car -Paediatric nurse -Drink alcoholic beverages -Take any medication unless instructed by your physician -Make any legal decisions or sign important papers.  Meals: Start with liquid foods such as gelatin or soup. Progress to regular foods as tolerated. Avoid greasy, spicy, heavy foods. If nausea and/or vomiting occur, drink only clear liquids until the nausea and/or vomiting subsides. Call your physician if vomiting continues.  Special Instructions/Symptoms: Your throat may feel dry or sore from the anesthesia or the breathing tube placed in your throat during surgery. If this causes discomfort, gargle with warm salt water. The discomfort should disappear within 24 hours.  If you had a scopolamine patch placed behind your ear for the management of post- operative nausea and/or vomiting:  1. The medication in the patch is effective for 72 hours, after which it should be removed.  Wrap patch in a tissue and discard in the trash. Wash hands thoroughly with soap and water. 2. You may remove the patch earlier than 72 hours if you experience unpleasant side effects which may include dry mouth, dizziness or visual disturbances. 3. Avoid touching the patch. Wash your hands with soap and water after contact with the patch.     Postoperative Instructions Hysteroscopy D & C  Dr. Phineas Real and the nursing staff have discussed postoperative instructions with you.  If you have any questions please ask them before you leave the hospital, or call Dr Elisabeth Most office at [phone].    We would like to emphasize the following instructions:   ? Call the office to make your follow-up appointment as recommended by Dr Phineas Real (usually 1-2 weeks).  ? You were given a prescription,  or one was ordered for you at the pharmacy you designated.  Get that prescription filled and take the medication according to instructions.  ? You may eat a regular diet, but slowly until you start having bowel movements.  ? Drink plenty of water daily.  ? Nothing in the vagina (intercourse, douching, objects of any kind) for two weeks.  When reinitiating intercourse, if it is uncomfortable, stop and make an appointment with Dr Phineas Real to be evaluated.  ? No driving for one to two days until the effects of anesthesia has worn off.  No traveling out of town for several days.  ? You may shower, but no baths for one week.  Walking up and down stairs is ok.  No heavy lifting, prolonged standing, repeated bending or any working out until your post op check.  ? Rest frequently, listen to your body and do not push yourself and overdo it.  ? Call if:  o Your pain medication does not seem strong enough. o Worsening pain or abdominal bloating o Persistent nausea or vomiting o Difficulty with urination or bowel movements. o Temperature of 101 degrees or higher. o Heavy vaginal bleeding.  If your period is due, you may use tampons. o You have any questions or concerns

## 2019-01-05 NOTE — Anesthesia Preprocedure Evaluation (Addendum)
Anesthesia Evaluation  Patient identified by MRN, date of birth, ID band Patient awake    Reviewed: Allergy & Precautions, NPO status , Patient's Chart, lab work & pertinent test results  History of Anesthesia Complications (+) PONV  Airway Mallampati: II  TM Distance: >3 FB Neck ROM: Full    Dental  (+) Dental Advisory Given, Teeth Intact   Pulmonary neg pulmonary ROS,    breath sounds clear to auscultation       Cardiovascular hypertension, Pt. on medications  Rhythm:Regular Rate:Normal     Neuro/Psych negative neurological ROS     GI/Hepatic hiatal hernia, GERD  ,(+) Hepatitis -S/p gastric sleeve   Endo/Other  Hypothyroidism   Renal/GU negative Renal ROS     Musculoskeletal  (+) Fibromyalgia -  Abdominal   Peds  Hematology negative hematology ROS (+)   Anesthesia Other Findings Pt states she has "no problem" with Fentanyl or Ketorolac intra -op.  Reproductive/Obstetrics                           COVID-19 Labs  No results for input(s): DDIMER, FERRITIN, LDH, CRP in the last 72 hours.  Lab Results  Component Value Date   SARSCOV2NAA NEGATIVE 01/01/2019   Lab Results  Component Value Date   WBC 7.2 01/01/2019   HGB 12.0 01/01/2019   HCT 37.9 01/01/2019   MCV 93.6 01/01/2019   PLT 299 01/01/2019   Lab Results  Component Value Date   CREATININE 0.67 01/01/2019   BUN 16 01/01/2019   NA 142 01/01/2019   K 3.2 (L) 01/01/2019   CL 112 (H) 01/01/2019   CO2 23 01/01/2019    Anesthesia Physical Anesthesia Plan  ASA: II  Anesthesia Plan: General   Post-op Pain Management:    Induction: Intravenous  PONV Risk Score and Plan: 3 and Midazolam, Dexamethasone, Ondansetron and Treatment may vary due to age or medical condition  Airway Management Planned: LMA  Additional Equipment:   Intra-op Plan:   Post-operative Plan: Extubation in OR  Informed Consent: I have  reviewed the patients History and Physical, chart, labs and discussed the procedure including the risks, benefits and alternatives for the proposed anesthesia with the patient or authorized representative who has indicated his/her understanding and acceptance.     Dental advisory given  Plan Discussed with: CRNA  Anesthesia Plan Comments:         Anesthesia Quick Evaluation

## 2019-01-07 ENCOUNTER — Encounter (HOSPITAL_BASED_OUTPATIENT_CLINIC_OR_DEPARTMENT_OTHER): Payer: Self-pay | Admitting: Gynecology

## 2019-01-08 ENCOUNTER — Other Ambulatory Visit: Payer: Self-pay | Admitting: Internal Medicine

## 2019-01-08 DIAGNOSIS — Z1231 Encounter for screening mammogram for malignant neoplasm of breast: Secondary | ICD-10-CM

## 2019-01-08 NOTE — Telephone Encounter (Signed)
I called patient since My [facility] email was returned unread.  I left her detailed message with result in voice mail per [facility] access note on file.

## 2019-01-08 NOTE — Anesthesia Postprocedure Evaluation (Signed)
Anesthesia Post Note  Patient: Stephanie [name]  Procedure(s) Performed: DILATATION & CURETTAGE/HYSTEROSCOPY WITH MYOSURE (N/A Uterus)     Patient location during evaluation: PACU Anesthesia Type: General Level of consciousness: awake and alert Pain management: pain level controlled Vital Signs Assessment: post-procedure vital signs reviewed and stable Respiratory status: spontaneous breathing, nonlabored ventilation, respiratory function stable and patient connected to nasal cannula oxygen Cardiovascular status: blood pressure returned to baseline and stable Postop Assessment: no apparent nausea or vomiting Anesthetic complications: no    Last Vitals:  Vitals:   01/05/19 1055 01/05/19 1117  BP:  (!) 157/80  Pulse:  65  Resp:  18  Temp: 36.9 C   SpO2:  100%    Last Pain:  Vitals:   01/07/19 1919  TempSrc:   PainSc: 0-No pain                 Tiajuana Amass

## 2019-02-02 ENCOUNTER — Other Ambulatory Visit: Payer: Self-pay

## 2019-02-02 ENCOUNTER — Ambulatory Visit
Admission: RE | Admit: 2019-02-02 | Discharge: 2019-02-02 | Disposition: A | Discharge status: Home / Self Care | Payer: BC Managed Care – PPO | Source: Ambulatory Visit

## 2019-02-02 DIAGNOSIS — Z1231 Encounter for screening mammogram for malignant neoplasm of breast: Secondary | ICD-10-CM | POA: Diagnosis not present

## 2019-02-26 ENCOUNTER — Encounter (HOSPITAL_COMMUNITY): Payer: Self-pay

## 2019-03-02 ENCOUNTER — Encounter: Payer: Self-pay | Admitting: Gynecology

## 2019-03-31 DIAGNOSIS — Z713 Dietary counseling and surveillance: Secondary | ICD-10-CM | POA: Diagnosis not present

## 2019-04-21 DIAGNOSIS — Z713 Dietary counseling and surveillance: Secondary | ICD-10-CM | POA: Diagnosis not present

## 2019-05-12 DIAGNOSIS — Z713 Dietary counseling and surveillance: Secondary | ICD-10-CM | POA: Diagnosis not present

## 2019-05-14 DIAGNOSIS — N912 Amenorrhea, unspecified: Secondary | ICD-10-CM | POA: Diagnosis not present

## 2019-05-14 DIAGNOSIS — E039 Hypothyroidism, unspecified: Secondary | ICD-10-CM | POA: Diagnosis not present

## 2019-05-14 DIAGNOSIS — Z85850 Personal history of malignant neoplasm of thyroid: Secondary | ICD-10-CM | POA: Diagnosis not present

## 2019-05-19 DIAGNOSIS — E039 Hypothyroidism, unspecified: Secondary | ICD-10-CM | POA: Diagnosis not present

## 2019-05-19 DIAGNOSIS — Z85850 Personal history of malignant neoplasm of thyroid: Secondary | ICD-10-CM | POA: Diagnosis not present

## 2019-06-06 DIAGNOSIS — Z8616 Personal history of COVID-19: Secondary | ICD-10-CM

## 2019-06-06 HISTORY — DX: Personal history of COVID-19: Z86.16

## 2019-06-10 ENCOUNTER — Ambulatory Visit: Payer: BC Managed Care – PPO | Attending: Internal Medicine

## 2019-06-10 DIAGNOSIS — Z20822 Contact with and (suspected) exposure to covid-19: Secondary | ICD-10-CM

## 2019-06-12 LAB — NOVEL CORONAVIRUS, NAA: SARS-CoV-2, NAA: DETECTED — AB

## 2019-06-21 ENCOUNTER — Emergency Department (HOSPITAL_COMMUNITY)
Admission: EM | Admit: 2019-06-21 | Discharge: 2019-06-21 | Disposition: A | Discharge status: Home / Self Care | Payer: BC Managed Care – PPO | Attending: Emergency Medicine | Admitting: Emergency Medicine

## 2019-06-21 ENCOUNTER — Emergency Department (HOSPITAL_COMMUNITY): Payer: BC Managed Care – PPO

## 2019-06-21 ENCOUNTER — Other Ambulatory Visit: Payer: Self-pay

## 2019-06-21 DIAGNOSIS — R0602 Shortness of breath: Secondary | ICD-10-CM | POA: Insufficient documentation

## 2019-06-21 DIAGNOSIS — Z85850 Personal history of malignant neoplasm of thyroid: Secondary | ICD-10-CM | POA: Insufficient documentation

## 2019-06-21 DIAGNOSIS — R0902 Hypoxemia: Secondary | ICD-10-CM | POA: Diagnosis not present

## 2019-06-21 DIAGNOSIS — I10 Essential (primary) hypertension: Secondary | ICD-10-CM | POA: Diagnosis not present

## 2019-06-21 DIAGNOSIS — E876 Hypokalemia: Secondary | ICD-10-CM | POA: Insufficient documentation

## 2019-06-21 DIAGNOSIS — R509 Fever, unspecified: Secondary | ICD-10-CM | POA: Diagnosis not present

## 2019-06-21 DIAGNOSIS — Z79899 Other long term (current) drug therapy: Secondary | ICD-10-CM | POA: Diagnosis not present

## 2019-06-21 DIAGNOSIS — U071 COVID-19: Secondary | ICD-10-CM

## 2019-06-21 LAB — CBC WITH DIFFERENTIAL/PLATELET
Abs Immature Granulocytes: 0.06 10*3/uL (ref 0.00–0.07)
Basophils Absolute: 0 10*3/uL (ref 0.0–0.1)
Basophils Relative: 0 %
Eosinophils Absolute: 0.1 10*3/uL (ref 0.0–0.5)
Eosinophils Relative: 1 %
HCT: 41.2 % (ref 36.0–46.0)
Hemoglobin: 14.2 g/dL (ref 12.0–15.0)
Immature Granulocytes: 1 %
Lymphocytes Relative: 14 %
Lymphs Abs: 1.3 10*3/uL (ref 0.7–4.0)
MCH: 30.9 pg (ref 26.0–34.0)
MCHC: 34.5 g/dL (ref 30.0–36.0)
MCV: 89.8 fL (ref 80.0–100.0)
Monocytes Absolute: 0.8 10*3/uL (ref 0.1–1.0)
Monocytes Relative: 9 %
Neutro Abs: 6.9 10*3/uL (ref 1.7–7.7)
Neutrophils Relative %: 75 %
Platelets: 313 10*3/uL (ref 150–400)
RBC: 4.59 MIL/uL (ref 3.87–5.11)
RDW: 12.3 % (ref 11.5–15.5)
WBC: 9.2 10*3/uL (ref 4.0–10.5)
nRBC: 0 % (ref 0.0–0.2)

## 2019-06-21 LAB — ABO/RH: ABO/RH(D): O NEG

## 2019-06-21 LAB — FIBRINOGEN: Fibrinogen: 759 mg/dL — ABNORMAL HIGH (ref 210–475)

## 2019-06-21 LAB — COMPREHENSIVE METABOLIC PANEL
ALT: 21 U/L (ref 0–44)
AST: 22 U/L (ref 15–41)
Albumin: 3.1 g/dL — ABNORMAL LOW (ref 3.5–5.0)
Alkaline Phosphatase: 53 U/L (ref 38–126)
Anion gap: 13 (ref 5–15)
BUN: 11 mg/dL (ref 6–20)
CO2: 28 mmol/L (ref 22–32)
Calcium: 8.6 mg/dL — ABNORMAL LOW (ref 8.9–10.3)
Chloride: 99 mmol/L (ref 98–111)
Creatinine, Ser: 0.71 mg/dL (ref 0.44–1.00)
GFR calc Af Amer: >60 mL/min (ref >60)
GFR calc non Af Amer: >60 mL/min (ref >60)
Glucose, Bld: 111 mg/dL — ABNORMAL HIGH (ref 70–99)
Potassium: 3.2 mmol/L — ABNORMAL LOW (ref 3.5–5.1)
Sodium: 140 mmol/L (ref 135–145)
Total Bilirubin: 0.9 mg/dL (ref 0.3–1.2)
Total Protein: 7 g/dL (ref 6.5–8.1)

## 2019-06-21 LAB — D-DIMER, QUANTITATIVE: D-Dimer, Quant: 0.82 ug/mL-FEU — ABNORMAL HIGH (ref 0.00–0.50)

## 2019-06-21 LAB — LACTATE DEHYDROGENASE: LDH: 283 U/L — ABNORMAL HIGH (ref 98–192)

## 2019-06-21 LAB — FERRITIN: Ferritin: 132 ng/mL (ref 11–307)

## 2019-06-21 LAB — PROCALCITONIN: Procalcitonin: <0.1 ng/mL

## 2019-06-21 LAB — TROPONIN I (HIGH SENSITIVITY)
Troponin I (High Sensitivity): 2 ng/L (ref <18)
Troponin I (High Sensitivity): 3 ng/L (ref <18)

## 2019-06-21 LAB — C-REACTIVE PROTEIN: CRP: 14.3 mg/dL — ABNORMAL HIGH (ref <1.0)

## 2019-06-21 LAB — HIV ANTIBODY (ROUTINE TESTING W REFLEX): HIV Screen 4th Generation wRfx: NONREACTIVE

## 2019-06-21 MED ORDER — DEXAMETHASONE SODIUM PHOSPHATE 10 MG/ML IJ SOLN
6.0000 mg | Freq: Once | INTRAMUSCULAR | Status: AC
  Administered 2019-06-21: 6 mg via INTRAVENOUS
  Filled 2019-06-21: qty 1

## 2019-06-21 MED ORDER — ACETAMINOPHEN 500 MG PO TABS: 500.0000 mg | ORAL_TABLET | Freq: Four times a day (QID) | ORAL | 0 refills | Status: DC | PRN

## 2019-06-21 MED ORDER — POTASSIUM CHLORIDE CRYS ER 20 MEQ PO TBCR
40.0000 meq | EXTENDED_RELEASE_TABLET | Freq: Once | ORAL | Status: AC
  Administered 2019-06-21: 20:00:00 40 meq via ORAL
  Filled 2019-06-21: qty 2

## 2019-06-21 NOTE — Discharge Instructions (Addendum)
Please call your primary care provider regarding today's encounter and to schedule repeat labs to ensure that you no longer have electrolyte derangement.  Be sure to eat potatoes, bananas, or take your daily multivitamin as prescribed.  Please continue to take Tylenol for your fevers, chills, and body aches.  Please continue to also monitor your oxygenation and shortness of breath.  Please return to the ED should you develop any new or worsening symptoms.  Please continue take your over-the-counter medications for relief of your flu-like symptoms.  I cannot emphasize the importance of regular meals and increased oral hydration in the setting of your COVID-19 diagnosis.

## 2019-06-21 NOTE — ED Triage Notes (Signed)
Pt reports diagnosed COVID + Jan 6th, continues with fever, SOB. PT awake, alert, appropriate, VSS.

## 2019-06-21 NOTE — ED Provider Notes (Signed)
[facility] EMERGENCY DEPARTMENT Provider Note   CSN: [id number] Arrival date & time: 06/21/19  1410     History Chief Complaint  Patient presents with  . Fever  . Shortness of Breath    Stephanie [name] is a 59 y.o. female with no significant PMH or comorbidities who tested positive for COVID-19 06/06/2019 and presents to the ED for ongoing symptoms of COVID-19.  Patient reports that she is living with her son who also had tested positive for COVID-19 and has been taking care of her.  She reports that she feels significantly improved from how she felt 1 week ago, but she finally received her pulse oximeter yesterday and she was having rates that were fluctuating between high 80s and low 90s, which prompted her to come to the ED for evaluation.  Patient also was hoping that she could receive IV fluids as she felt as though she was dehydrated.  She has been taking Tylenol, ibuprofen, Mucinex, and other over-the-counter medications for relief of her symptoms.  She endorses intermittent headache, body aches, mild cough, diminished appetite, and fatigue.  She denies any dizziness, chest pain or abdominal pain, difficulty breathing, nausea or vomiting, urinary symptoms, or changes in bowel habits.  HPI     Past Medical History:  Diagnosis Date  . Endometrial polyp   . GERD (gastroesophageal reflux disease)   . Headache   . Herpes genitalia   . History of hiatal hernia    s/p  repair with gastric sleeve 05-14-2016  . 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 Hypertension   . Irregular uterine bleeding    heavy  . Polycystic ovarian syndrome   . PONV (postoperative nausea and vomiting)   . Post-surgical hypothyroidism    endocrinologist--- dr Buddy Duty  . 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 Uterine  fibroid   . Wears glasses     Patient Active Problem List   Diagnosis Date Noted  . 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 Multiple thyroid nodules 08/31/2017  . S/P laparoscopic sleeve gastrectomy Dec 2017 05/14/2016  . Thyroid nodule 05/24/2013  . Steatohepatitis, nonalcoholic - mild [id number]  . Obesity (BMI 30-39.9) 04/26/2013  . Cholelithiasis and acute cholecystitis s/p lap chole 04/07/2013 04/08/2013  . Bronchospasm 12/09/2012  . Dermatitis 12/09/2012  . Acne 03/06/2012  . Single cyst of left breast 04/23/2011  . Allergy   . Polycystic ovarian syndrome   . Hypertension   . Thyroid disease   . Herpes genitalia   . Obesity     Past Surgical History:  Procedure Laterality Date  . BREAST REDUCTION SURGERY  1994  . CESAREAN SECTION  5/04  . CHOLECYSTECTOMY N/A 04/07/2013   Procedure: LAPAROSCOPIC CHOLECYSTECTOMY;  Surgeon: Adin Hector, MD;  Location: [facility];  Service: General;  Laterality: N/A;  . DILATATION & CURETTAGE/HYSTEROSCOPY WITH MYOSURE N/A 01/05/2019   Procedure: [place];  Surgeon: Anastasio Auerbach, MD;  Location: [facility];  Service: Gynecology;  Laterality: N/A;  request to follow at 9:00am in [facility] block requests one hour  . HYSTEROSCOPY W/ RESECTION POLYP  09-08-2001   dr Carren Rang  @[facility]   . LAPAROSCOPIC GASTRIC SLEEVE RESECTION WITH HIATAL HERNIA REPAIR N/A 05/14/2016   Procedure: LAPAROSCOPIC GASTRIC SLEEVE RESECTION WITH POSSIBLE  HIATAL HERNIA REPAIR, UPPER ENDO;  Surgeon: Johnathan Hausen, MD;  Location: [facility];  Service: General;  Laterality: N/A;  . LIVER BIOPSY N/A 04/07/2013   Procedure: LIVER BIOPSY;  Surgeon: Adin Hector, MD;  Location: [facility];  Service: General;  Laterality: N/A;  . MYOMECTOMY ABDOMINAL APPROACH  11/2000  . THYROIDECTOMY N/A 09/02/2017   Procedure: TOTAL THYROIDECTOMY;  Surgeon: Armandina Gemma, MD;  Location: [facility];  Service: General;  Laterality:  N/A;     OB History    Gravida  1   Para  1   Term      Preterm      AB  0   Living  1     SAB      TAB      Ectopic  0   Multiple      Live Births              Family History  Problem Relation Age of Onset  . Cancer Mother        leukemia  . Hypertension Father   . Heart disease Maternal Grandmother   . Heart disease Maternal Grandfather   . Aneurysm Maternal Grandfather   . Aneurysm Paternal Grandmother     Social History   Tobacco Use  . Smoking status: Never Smoker  . Smokeless tobacco: Never Used  Substance Use Topics  . Alcohol use: Not Currently  . Drug use: No    Home Medications Prior to Admission medications   Medication Sig Start Date End Date Taking? Authorizing Provider  acetaminophen (TYLENOL) 500 MG tablet Take 1 tablet (500 mg total) by mouth every 6 (six) hours as needed. 06/21/19   Corena Herter, PA-C  benazepril (LOTENSIN) 20 MG tablet Take 1 tablet (20 mg total) by mouth daily. Patient taking differently: Take 20 mg by mouth daily.  10/03/14   Schoenhoff, Altamese Cabal, MD  CALCIUM PO Take 1,000 mg by mouth daily.    [provider]  Multiple Vitamin (MULTIVITAMIN WITH MINERALS) TABS tablet Take 1 tablet by mouth daily.    [provider]  Potassium Chloride ER 20 MEQ TBCR Take one tablet by mouth 3 times today 01/04/19   Fontaine, Belinda Block, MD  predniSONE (DELTASONE) 10 MG tablet  01/25/19   [provider]  SYNTHROID 150 MCG tablet  01/13/19   [provider]  SYNTHROID 75 MCG tablet Take 75 mcg by mouth daily before breakfast.  05/17/14   [provider]    Allergies    Dilaudid [hydromorphone], Fentanyl, Morphine and related, and Nsaids  Review of Systems   Review of Systems  All other systems reviewed and are negative.   Physical Exam Updated Vital Signs BP (!) 137/92 (BP Location: Right Arm)   Pulse 90   Temp 97.7 F (36.5 C) (Oral)   Resp 20   Ht 5\' 5"  (1.651 m)   Wt  102.1 kg   LMP  (LMP Unknown)   SpO2 99%   BMI 37.44 kg/m   Physical Exam Vitals and nursing note reviewed. Exam conducted with a chaperone present.  Constitutional:      Appearance: Normal appearance.  HENT:     Head: Normocephalic and atraumatic.  Eyes:     General: No scleral icterus.    Conjunctiva/sclera: Conjunctivae normal.  Cardiovascular:     Rate and Rhythm: Normal rate and regular rhythm.     Pulses: Normal pulses.     Heart sounds: Normal heart sounds.  Pulmonary:     Comments: No significant increased work of breathing.  Breath sounds intact bilaterally.  She is able to speak full sentences without difficulty.  No accessory muscle use. Abdominal:     General: Abdomen is flat. There is no distension.     Palpations: Abdomen is soft.     Tenderness: There is no abdominal tenderness. There is no guarding.  Musculoskeletal:     Cervical back: Normal range of motion and neck supple. No rigidity.  Skin:    General: Skin is warm.  Neurological:     Mental Status: She is alert and oriented to person, place, and time.     GCS: GCS eye subscore is 4. GCS verbal subscore is 5. GCS motor subscore is 6.  Psychiatric:        Mood and Affect: Mood normal.        Behavior: Behavior normal.        Thought Content: Thought content normal.     ED Results / Procedures / Treatments   Labs (all labs ordered are listed, but only abnormal results are displayed) Labs Reviewed  C-REACTIVE PROTEIN - Abnormal; Notable for the following components:      Result Value   CRP 14.3 (*)    All other components within normal limits  COMPREHENSIVE METABOLIC PANEL - Abnormal; Notable for the following components:   Potassium 3.2 (*)    Glucose, Bld 111 (*)    Calcium 8.6 (*)    Albumin 3.1 (*)    All other components within normal limits  D-DIMER, QUANTITATIVE (NOT AT [facility]) - Abnormal; Notable for the following components:   D-Dimer, Quant 0.82 (*)    All other components within normal  limits  FIBRINOGEN - Abnormal; Notable for the following components:   Fibrinogen 759 (*)    All other components within normal limits  LACTATE DEHYDROGENASE - Abnormal; Notable for the following components:   LDH 283 (*)    All other components within normal limits  HIV ANTIBODY (ROUTINE TESTING W REFLEX)  CBC WITH DIFFERENTIAL/PLATELET  FERRITIN  PROCALCITONIN  ABO/RH  TROPONIN I (HIGH SENSITIVITY)  TROPONIN I (HIGH SENSITIVITY)    EKG None  Radiology Portable chest 1 View  Result Date: 06/21/2019 CLINICAL DATA:  59 year female with shortness of breath. Positive COVID-19. EXAM: PORTABLE CHEST 1 VIEW COMPARISON:  Chest radiograph dated 08/25/2017. FINDINGS: Scattered bilateral confluent densities most consistent with multifocal pneumonia. Clinical correlation and follow-up recommended. There is no pleural effusion or pneumothorax. Stable cardiomediastinal silhouette. No acute osseous pathology. IMPRESSION: Multifocal pneumonia. Clinical correlation and follow-up recommended. Electronically Signed   By: Anner Crete M.D.   On: 06/21/2019 18:13    Procedures Procedures (including critical care time)  Medications Ordered in ED Medications  potassium chloride SA (KLOR-CON) CR tablet 40 mEq (has no administration in time range)  dexamethasone (DECADRON) injection 6 mg (has no administration in time range)    ED Course  I have reviewed the triage vital signs and the nursing notes.  Pertinent labs & imaging results that were available during my care of the patient were reviewed by me and considered in my medical decision making (see chart for details).    MDM Rules/Calculators/A&P                      DG chest demonstrates scattered bilateral densities consistent with a multifocal pneumonia..  Preadmission COVID-19 labs had been previously ordered which demonstrates elevated LDH, CRP, fibrinogen,  D-dimer, as expected.  She denies any history of clots, clotting  disorder, pleuritic chest pain, pleuritic cough, leg swelling, or recent immobilization otherwise concerning for pulmonary embolism or DVT.  Her initial troponin was 2 and repeat troponin was within normal limits.  She denies any chest pain or diaphoresis and I have low suspicion for ACS.  CMP demonstrates hypokalemia to 3.2.  Will replenish with 40 mEq K-Dur and encourage patient to consume potatoes and bananas outpatient.  She will need to receive repeat lab testing in 2 to 3 days to confirm that her electrolyte derangement had resolved.  Her CBC was all within normal limits.  While on pulse oximetry, her oxygenation is fluctuating between 90 to 99% on RA.  I personally ambulated patient and her oxygenation stayed in that range with no increased work of breathing.  I informed her that at time she is borderline hypoxic and given her lack of previous supplemental oxygen requirement, I am concerned.  However, she is adamant that she would rather be at home with her son who she reports takes good care of her.  She also reiterated that she is feeling significantly improved from how she felt 1 week ago and simply wanted to ensure that she was not particularly dehydrated requiring IV fluid resuscitation.  I informed her that IV fluid resuscitation in the setting of COVID-19 pneumonia carries its own risks of worsening shortness of breath symptoms.  Regardless, she does not appear clinically dehydrated and she is hemodynamically stable.    Patient may be discharged, but with very strict return precautions provided.  She is tolerating p.o. fluids at bedside without issue.  All of the evaluation and work-up results were discussed with the patient and any family at bedside. They were provided opportunity to ask any additional questions and have none at this time. They have expressed understanding of verbal discharge instructions as well as return precautions and are agreeable to the plan.   Stephanie [name] was  evaluated in Emergency Department on 06/21/2019 for the symptoms described in the history of present illness. She was evaluated in the context of the global COVID-19 pandemic, which necessitated consideration that the patient might be at risk for infection with the SARS-CoV-2 virus that causes COVID-19. Institutional protocols and algorithms that pertain to the evaluation of patients at risk for COVID-19 are in a state of rapid change based on information released by regulatory bodies including the CDC and federal and state organizations. These policies and algorithms were followed during the patient's care in the ED.   Final Clinical Impression(s) / ED Diagnoses Final diagnoses:  Shortness of breath  COVID-19    Rx / DC Orders ED Discharge Orders         Ordered    acetaminophen (TYLENOL) 500 MG tablet  Every 6 hours PRN     06/21/19 1923           Reita Chard 06/21/19 1927    Lacretia Leigh, MD 06/22/19 1955

## 2019-06-30 DIAGNOSIS — U071 COVID-19: Secondary | ICD-10-CM | POA: Diagnosis not present

## 2019-06-30 DIAGNOSIS — E559 Vitamin D deficiency, unspecified: Secondary | ICD-10-CM | POA: Diagnosis not present

## 2019-06-30 DIAGNOSIS — Z9884 Bariatric surgery status: Secondary | ICD-10-CM | POA: Diagnosis not present

## 2019-06-30 DIAGNOSIS — E876 Hypokalemia: Secondary | ICD-10-CM | POA: Diagnosis not present

## 2019-07-14 DIAGNOSIS — Z13220 Encounter for screening for lipoid disorders: Secondary | ICD-10-CM | POA: Diagnosis not present

## 2019-07-14 DIAGNOSIS — E876 Hypokalemia: Secondary | ICD-10-CM | POA: Diagnosis not present

## 2019-07-14 DIAGNOSIS — E559 Vitamin D deficiency, unspecified: Secondary | ICD-10-CM | POA: Diagnosis not present

## 2019-07-14 DIAGNOSIS — E039 Hypothyroidism, unspecified: Secondary | ICD-10-CM | POA: Diagnosis not present

## 2019-07-14 DIAGNOSIS — Z1389 Encounter for screening for other disorder: Secondary | ICD-10-CM | POA: Diagnosis not present

## 2019-07-14 DIAGNOSIS — Z9884 Bariatric surgery status: Secondary | ICD-10-CM | POA: Diagnosis not present

## 2019-07-14 DIAGNOSIS — I10 Essential (primary) hypertension: Secondary | ICD-10-CM | POA: Diagnosis not present

## 2019-07-29 DIAGNOSIS — Z713 Dietary counseling and surveillance: Secondary | ICD-10-CM | POA: Diagnosis not present

## 2019-09-10 ENCOUNTER — Ambulatory Visit: Payer: BC Managed Care – PPO | Attending: Internal Medicine

## 2019-09-10 DIAGNOSIS — E039 Hypothyroidism, unspecified: Secondary | ICD-10-CM | POA: Diagnosis not present

## 2019-09-15 DIAGNOSIS — E039 Hypothyroidism, unspecified: Secondary | ICD-10-CM | POA: Diagnosis not present

## 2019-09-15 DIAGNOSIS — L659 Nonscarring hair loss, unspecified: Secondary | ICD-10-CM | POA: Diagnosis not present

## 2019-09-15 DIAGNOSIS — Z85850 Personal history of malignant neoplasm of thyroid: Secondary | ICD-10-CM | POA: Diagnosis not present

## 2019-10-29 DIAGNOSIS — E039 Hypothyroidism, unspecified: Secondary | ICD-10-CM | POA: Diagnosis not present

## 2019-12-09 ENCOUNTER — Encounter (HOSPITAL_COMMUNITY): Payer: Self-pay

## 2020-01-21 ENCOUNTER — Telehealth: Payer: Self-pay | Admitting: *Deleted

## 2020-01-21 MED ORDER — MEGESTROL ACETATE 20 MG PO TABS: 20.0000 mg | ORAL_TABLET | Freq: Every day | ORAL | 0 refills | Status: DC

## 2020-01-21 NOTE — Telephone Encounter (Signed)
Megace 20 mg daily x 30 tabs no RF, and yes needs to come in for annual

## 2020-01-21 NOTE — Telephone Encounter (Signed)
Patient informed, Rx sent.  

## 2020-01-21 NOTE — Telephone Encounter (Signed)
Patient had D&C in 01/2019 not bleeding for 9 months after surgery, yesterday bleeding started and now has increased heavier. Patient is wearing tampon and pad and changing both every 2 hours, reports she doesn't want to leave the house becomes of the flow being so heavy. She asked if you would be willing to send Rx in to help decrease the flow? Has taken megace in past and done well. She is overdue for annual and I will have her schedule this. Please advise

## 2020-01-28 IMAGING — MG MM DIGITAL SCREENING BILAT W/ TOMO W/ CAD
8 series · 8 of 24 positions shown · non-contrast
Comparison: Previous exam(s).

CLINICAL DATA: Screening. History of bilateral reduction
mammoplasty.

EXAM:
DIGITAL SCREENING BILATERAL MAMMOGRAM WITH TOMO AND CAD

[L CC synth-2D]
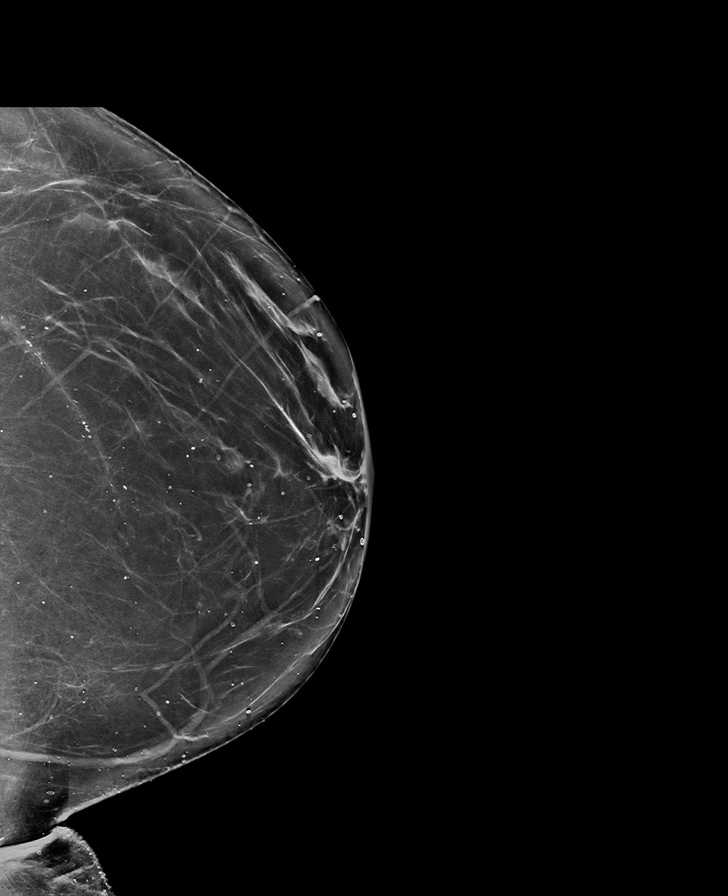

[R MLO synth-2D]
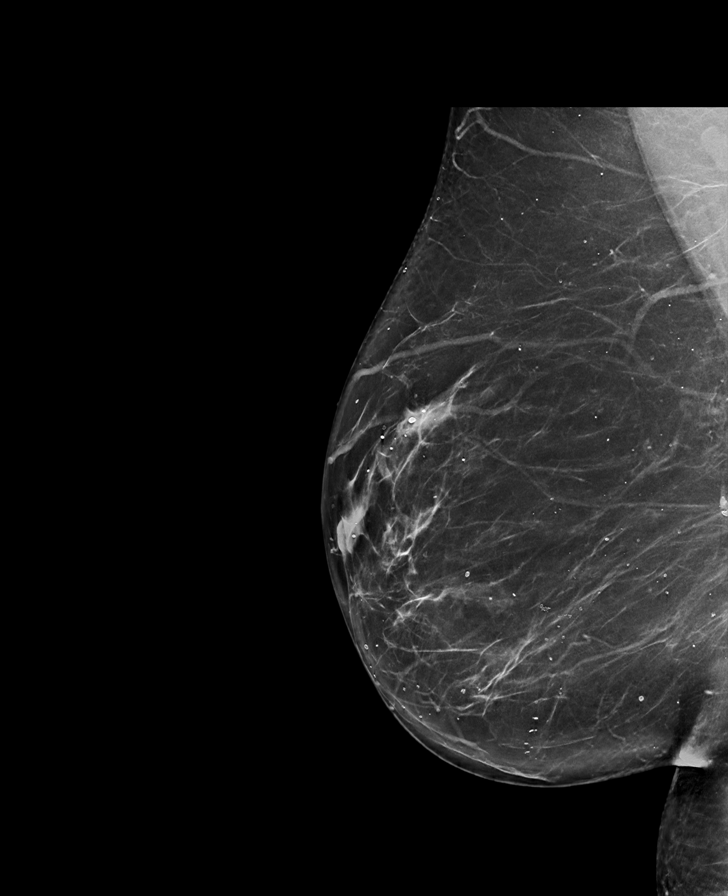

[R CC synth-2D]
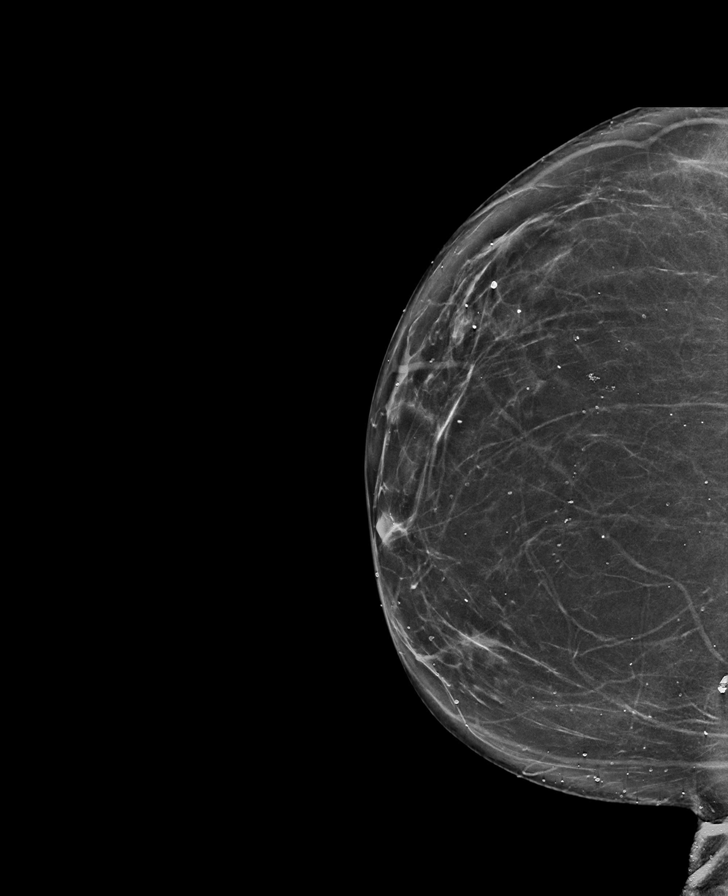

[L MLO synth-2D]
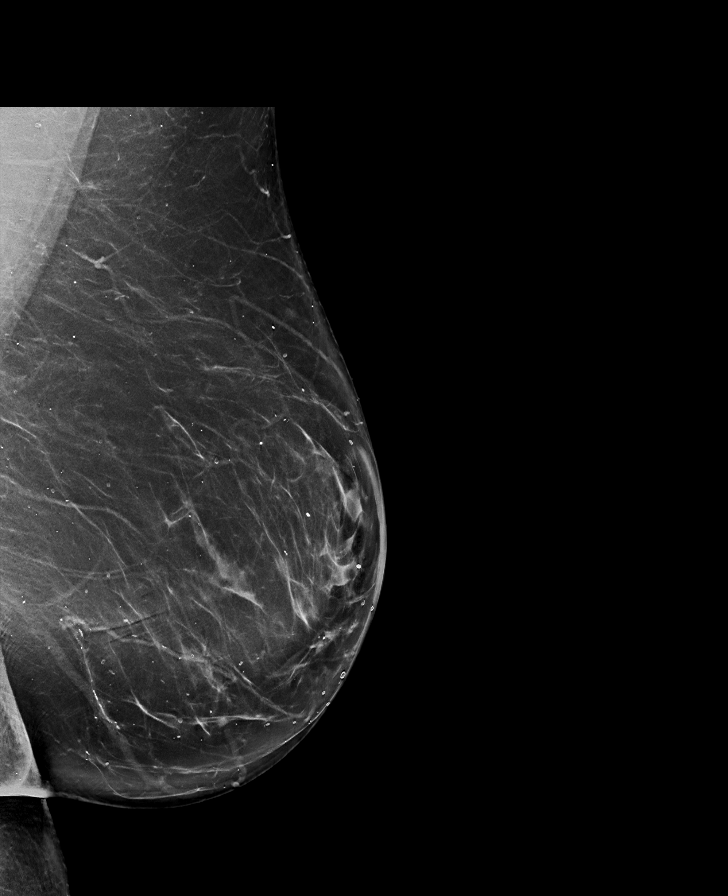

[L CC tomo · tomo slice 40/79.0]
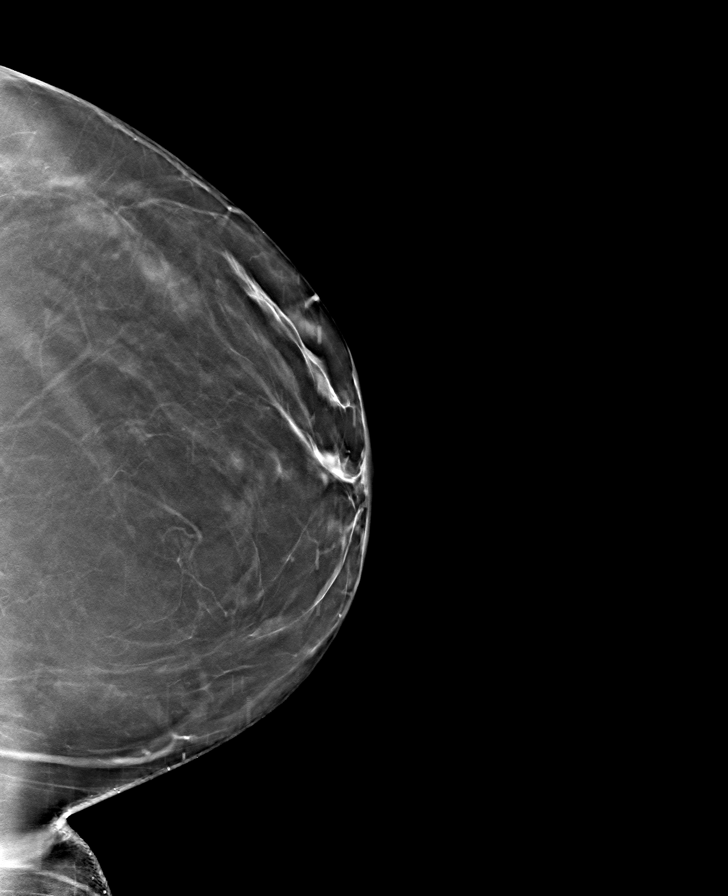

[R CC tomo · tomo slice 36/71.0]
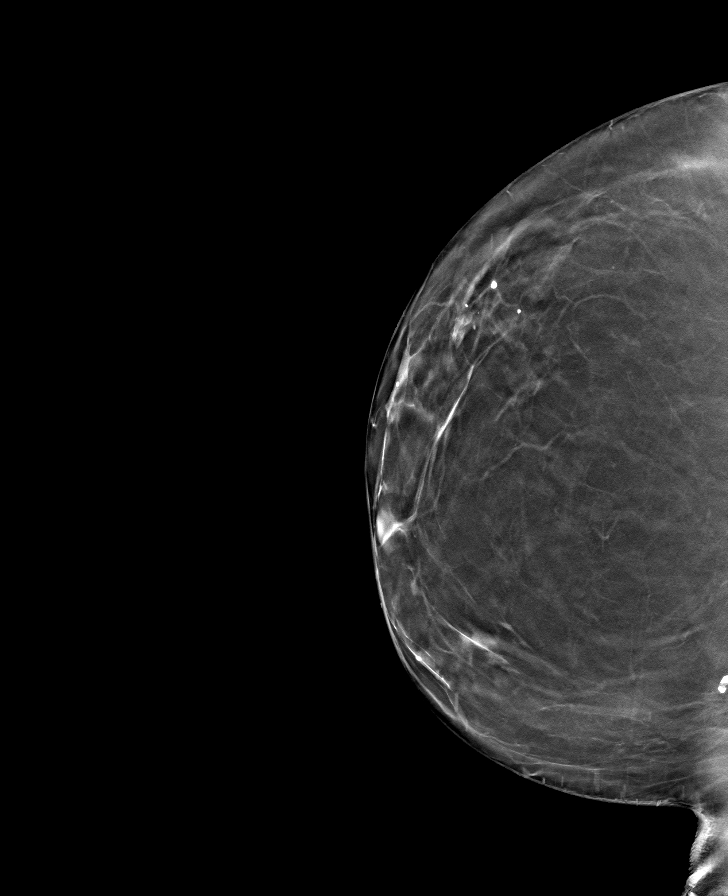

[R MLO tomo · tomo slice 42/83.0]
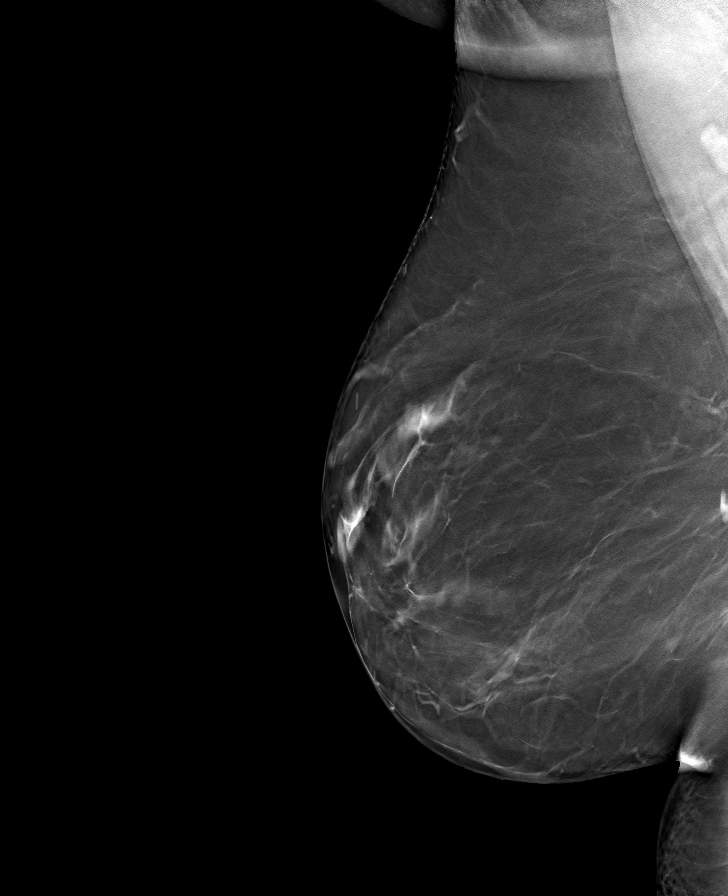

[L MLO tomo · tomo slice 45/88.0]
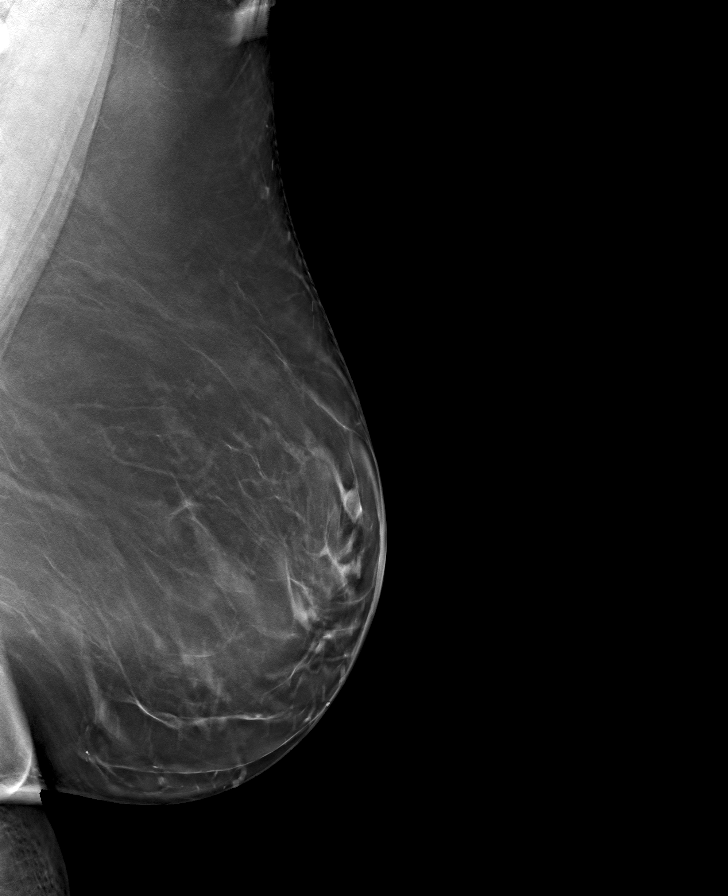

[8 of 24 positions shown; findings below may reference images not displayed]

ACR Breast Density Category b: There are scattered areas of
fibroglandular density.
FINDINGS: There are no findings suspicious for malignancy. Images were
processed with CAD.
IMPRESSION: No mammographic evidence of malignancy. A result letter of this
screening mammogram will be mailed directly to the patient.

RECOMMENDATION:
Screening mammogram in one year. (Code:LA-Z-AIQ)

BI-RADS CATEGORY  1: Negative.

## 2020-02-04 ENCOUNTER — Encounter: Payer: BC Managed Care – PPO | Admitting: Obstetrics and Gynecology

## 2020-02-16 ENCOUNTER — Encounter: Payer: Self-pay | Admitting: Obstetrics and Gynecology

## 2020-02-16 ENCOUNTER — Ambulatory Visit (INDEPENDENT_AMBULATORY_CARE_PROVIDER_SITE_OTHER): Payer: BC Managed Care – PPO | Admitting: Obstetrics and Gynecology

## 2020-02-16 ENCOUNTER — Other Ambulatory Visit: Payer: Self-pay

## 2020-02-16 VITALS — BP 120/78 | Ht 64.5 in | Wt 232.0 lb

## 2020-02-16 DIAGNOSIS — N939 Abnormal uterine and vaginal bleeding, unspecified: Secondary | ICD-10-CM

## 2020-02-16 DIAGNOSIS — Z01419 Encounter for gynecological examination (general) (routine) without abnormal findings: Secondary | ICD-10-CM | POA: Diagnosis not present

## 2020-02-16 DIAGNOSIS — N926 Irregular menstruation, unspecified: Secondary | ICD-10-CM

## 2020-02-16 DIAGNOSIS — Z23 Encounter for immunization: Secondary | ICD-10-CM | POA: Diagnosis not present

## 2020-02-16 DIAGNOSIS — Z124 Encounter for screening for malignant neoplasm of cervix: Secondary | ICD-10-CM | POA: Diagnosis not present

## 2020-02-16 NOTE — Progress Notes (Signed)
Stephanie [name] 09-28-1960 [id number]  SUBJECTIVE:  59 y.o. G1P0001 female for annual routine gynecologic exam and Pap smear. She does have some gynecologic concerns with abnormal bleeding as noted below.  Current Outpatient Medications  Medication Sig Dispense Refill  . acetaminophen (TYLENOL) 500 MG tablet Take 1 tablet (500 mg total) by mouth every 6 (six) hours as needed. 30 tablet 0  . benazepril (LOTENSIN) 20 MG tablet Take 1 tablet (20 mg total) by mouth daily. (Patient taking differently: Take 20 mg by mouth daily. ) 90 tablet 1  . CALCIUM PO Take 1,000 mg by mouth daily.    Marland Kitchen levothyroxine (SYNTHROID) 175 MCG tablet Take 175 mcg by mouth daily before breakfast.    . Multiple Vitamin (MULTIVITAMIN WITH MINERALS) TABS tablet Take 1 tablet by mouth daily.    . Semaglutide-Weight Management (WEGOVY) 0.25 MG/0.5ML SOAJ Inject 0.25 mg into the skin.    . megestrol (MEGACE) 20 MG tablet Take 1 tablet (20 mg total) by mouth daily. (Patient not taking: Reported on 02/16/2020) 30 tablet 0  . Potassium Chloride ER 20 MEQ TBCR Take one tablet by mouth 3 times today (Patient not taking: Reported on 02/16/2020) 3 tablet 0  . predniSONE (DELTASONE) 10 MG tablet  (Patient not taking: Reported on 02/16/2020)     No current facility-administered medications for this visit.   Allergies: Dilaudid [hydromorphone], Fentanyl, Morphine and related, and Nsaids  No LMP recorded (lmp unknown). Patient is postmenopausal.  Past medical history,surgical history, problem list, medications, allergies, family history and social history were all reviewed and documented as reviewed in the EPIC chart.  ROS: Pertinent positives and negatives as reviewed in HPI.   OBJECTIVE:  BP 120/78 (BP Location: Right Arm, Patient Position: Sitting, Cuff Size: Normal)   Ht 5' 4.5" (1.638 m)   Wt 232 lb (105.2 kg)   LMP  (LMP Unknown)   BMI 39.21 kg/m  The patient appears well, alert, oriented, in no distress.  BREAST  EXAM: breasts appear normal, no suspicious masses, no skin or nipple changes or axillary nodes  PELVIC EXAM: VULVA: normal appearing vulva with no masses, tenderness or lesions, VAGINA: normal appearing vagina with normal color and discharge, no lesions, CERVIX: normal appearing cervix without discharge or lesions, UTERUS & ADNEXA: Difficult to palpate but no notable abnormalities, nontender, PAP: Pap smear done today, thin-prep method  Chaperone: Aurora Mask (DNP student) present during the examination and performed the pelvic exam with me in attendance to confirm the exam findings. Caryn Bee present during the examination  ASSESSMENT:  59 y.o. G1P0001 here for annual gynecologic exam  PLAN:   1.  Perimenopausal/abnormal uterine bleeding.  She has not been amenorrheic for one year yet so we reviewed she is not menopausal.  [facility] 11/2018 was only 16.2.  She had a benign endometrial polyp and endometrial tissue detected during hysteroscopy D&C polypectomy for abnormal bleeding last year.  She says she went for about 9 months without a period after the procedure but then had another period again which was very heavy for about 2 weeks and she is still having spotting.  She reports her recent thyroid test was normal at her PCP office.  I recommend repeating a pelvic ultrasound.  She apparently has a history of PCOS and risk factors for endometrial hyperplasia so I would recommend starting a work-up now.  She will schedule a pelvic ultrasound at checkout today. 2. Pap smear/HPV 07/2015.  Pap smear is collected today. 3. Mammogram 02/2019.  Normal breast exam today.  Says she has a mammogram coming up. 4. Colonoscopy 2018.  She will follow up at the interval recommended to her. 5. Health maintenance.  No labs today as she normally has these completed with her primary care provider.  Influenza vaccination administered.  Additional time beyond the routine annual exam was spent in discussing abnormal uterine  bleeding work-up and management.    Joseph Pierini MD 02/16/20

## 2020-02-18 LAB — PAP IG W/ RFLX HPV ASCU

## 2020-02-25 ENCOUNTER — Ambulatory Visit
Admission: RE | Admit: 2020-02-25 | Discharge: 2020-02-25 | Disposition: A | Discharge status: Home / Self Care | Payer: BC Managed Care – PPO | Source: Ambulatory Visit | Attending: Obstetrics and Gynecology | Admitting: Obstetrics and Gynecology

## 2020-02-25 ENCOUNTER — Other Ambulatory Visit: Payer: Self-pay | Admitting: Obstetrics and Gynecology

## 2020-02-25 ENCOUNTER — Other Ambulatory Visit: Payer: Self-pay

## 2020-02-25 DIAGNOSIS — Z1231 Encounter for screening mammogram for malignant neoplasm of breast: Secondary | ICD-10-CM

## 2020-03-09 ENCOUNTER — Encounter: Payer: Self-pay | Admitting: Obstetrics and Gynecology

## 2020-03-09 ENCOUNTER — Ambulatory Visit: Payer: BC Managed Care – PPO | Admitting: Obstetrics and Gynecology

## 2020-03-09 ENCOUNTER — Other Ambulatory Visit: Payer: Self-pay

## 2020-03-09 ENCOUNTER — Other Ambulatory Visit: Payer: Self-pay | Admitting: Obstetrics and Gynecology

## 2020-03-09 ENCOUNTER — Ambulatory Visit (INDEPENDENT_AMBULATORY_CARE_PROVIDER_SITE_OTHER): Payer: BC Managed Care – PPO

## 2020-03-09 VITALS — BP 124/80

## 2020-03-09 DIAGNOSIS — N926 Irregular menstruation, unspecified: Secondary | ICD-10-CM

## 2020-03-09 DIAGNOSIS — N939 Abnormal uterine and vaginal bleeding, unspecified: Secondary | ICD-10-CM

## 2020-03-09 DIAGNOSIS — N854 Malposition of uterus: Secondary | ICD-10-CM | POA: Diagnosis not present

## 2020-03-09 DIAGNOSIS — D259 Leiomyoma of uterus, unspecified: Secondary | ICD-10-CM | POA: Diagnosis not present

## 2020-03-09 DIAGNOSIS — R9389 Abnormal findings on diagnostic imaging of other specified body structures: Secondary | ICD-10-CM | POA: Diagnosis not present

## 2020-03-09 DIAGNOSIS — N840 Polyp of corpus uteri: Secondary | ICD-10-CM

## 2020-03-09 NOTE — Progress Notes (Signed)
Stephanie [name] 58-Jan-1962 [id number]  SUBJECTIVE:  59 y.o. G1P0001 female presents for a sonohysterogram due to an abnormal uterine bleeding episode with prolonged heavy period and spotting which had resolved.  She is currently not having any active bleeding.  She did have a hysteroscopy D&C last year with removal of benign polyp tissue.   Allergies: Dilaudid [hydromorphone], Fentanyl, Morphine and related, and Nsaids  No LMP recorded (lmp unknown). Patient is postmenopausal.  Past medical history,surgical history, problem list, medications, allergies, family history and social history were all reviewed and documented as reviewed in the EPIC chart.   OBJECTIVE:  BP 124/80   LMP  (LMP Unknown)  The patient appears well, alert, oriented x 3, in no distress. PELVIC EXAM: VULVA: normal appearing vulva with no masses, tenderness or lesions, VAGINA: normal appearing vagina with normal color and discharge, no lesions, CERVIX: normal appearing cervix without discharge or lesions  Pelvic ultrasound and sonohysterogram Retroverted uterus 9.4 x 6.2 x 5.6 cm Endometrial thickness 8.6 mm. Several fibroids, largest is 6.4 x 4.0 cm, pedunculated at the left uterus Right ovary with stable residual follicles all are simple and avascular in appearance. Left ovary not well visualized due to the large left uterine fibroid. No other adnexal masses.  No free fluid. Sonohysterogram is performed by placing a speculum to view the cervix which was grasped with an Allis clamp and the cervix was cleansed with a Betadine swab x2.  The sonohysterogram catheter was then inserted using aseptic technique into the endometrial cavity.  Slow injection of 30 mL of 0.9% normal saline was conducted as the transvaginal probe was used to perform sonogram in real-time. There appeared to be a filling defect consistent with an endometrial polyp 1.1 cm located in the lower uterine segment.  Chaperone: G. Whitley, RDMS, present  during the examination  ASSESSMENT:  59 y.o. G1P0001 here for sonohysterogram, suspected endometrial polyp, known fibroid uterus  PLAN:  We discussed the filling defect identified suggest 3 formation of an endometrial polyp.  This is rather short interval to development of another polyp since it has been just over a year since her last D&C.  However, her polyps last year were much larger in size than how the polyp appears on today's imaging. We discussed the possibility of an excess endogenous estrogen environment and relation to BMI and any excess adiposity. I would recommend removing the polyp for analysis.  We discussed we could also look at considering a hysterectomy since she is having a propensity towards developing abnormal uterine bleeding and polyps, but understanding that she is likely nearing menopause, she would rather pursue the more conservative option at this time.  I also brought up the idea of an oral progestin or we could even consider a levonorgestrel IUD to try to counteract the process that is developing the polyps.  She would like to just stick with a hysteroscopy D&C at this time and then see how it goes from there in the year or two to follow as she hopefully goes through the menopausal transition and menstrual bleeding ceases. I discussed the same-day outpatient intent of the procedure, and risks of infection, bleeding, possible need for blood transfusion, perforation of uterus and/or cervix resulting in injury to surrounding organ structures including major pelvic blood vessels, bowel, bladder, and potentially ureter, and DVT.  Anesthesia will either be general or MAC depending on the anesthesiologist's choice, and inherent risks with being placed under anesthesia include myocardial infarction, stroke, rarely death.  Very  rarely would laparoscopy and/or laparotomy be indicated to tend to any intra-abdominal hemorrhage and/or injury concern.  Postoperative recovery expectations of  needing a day or two away from employment duties in the absence of any complication were also discussed.   I will have office staff reach out to her to help coordinate the procedure.  All questions were answered by the end of the visit.  Joseph Pierini MD 03/09/20

## 2020-03-10 ENCOUNTER — Telehealth: Payer: Self-pay

## 2020-03-10 NOTE — Telephone Encounter (Signed)
Left message for patient to call me. (to discuss surgery scheduling)

## 2020-03-20 ENCOUNTER — Telehealth: Payer: Self-pay

## 2020-03-20 NOTE — Telephone Encounter (Signed)
Patient called stating she is ready to schedule surgery.   I called her back and discussed her insurance benefits and her estimated [facility] surgery prepayment amount due.  We discussed dates and times available in November. Right now our 3 block dates left have nothing scheduled and start time in OR is 7:30am. Patient was hoping for a little later OR time since she will have to ask someone to bring her. She said she is going to hold off scheduling for now.  I told her if I do schedule a case on any of these dates and she can follow at a later time I will call her.

## 2020-03-21 NOTE — Telephone Encounter (Signed)
I called patient to let her know I has scheduled a case for 04/10/20 and she could follow at 8:45am to see if that might work better for her. She said she had been considering it all and realized she has not met any of her deductible/co-ins and it starts on 06/03/2020.  She has decided she wants to wait until January to have surgery. I reminded her no way to know for sure polyp is benign until it is removed and examined.  She understood this and is comfortable waiting.  I will call her when I get January schedule. She still wants to follow someone else's case so that she won't have to arrive so early.

## 2020-03-22 NOTE — Telephone Encounter (Signed)
Noted! Thank you

## 2020-05-12 DIAGNOSIS — E039 Hypothyroidism, unspecified: Secondary | ICD-10-CM | POA: Diagnosis not present

## 2020-05-12 DIAGNOSIS — Z85850 Personal history of malignant neoplasm of thyroid: Secondary | ICD-10-CM | POA: Diagnosis not present

## 2020-05-18 DIAGNOSIS — E039 Hypothyroidism, unspecified: Secondary | ICD-10-CM | POA: Diagnosis not present

## 2020-05-18 DIAGNOSIS — Z85850 Personal history of malignant neoplasm of thyroid: Secondary | ICD-10-CM | POA: Diagnosis not present

## 2020-06-13 ENCOUNTER — Telehealth: Payer: Self-pay

## 2020-06-13 NOTE — Telephone Encounter (Signed)
I called and spoke with patient about scheduling surgery in January. I did make her aware that Dr. Delilah Shan is leaving the practice in March and will not be scheduling surgery past January. Patient is ready to schedule.  I scheduled her for 06/23/20 at 8:30am at [facility].  I scheduled COvid testing for her and advised regarding quarantine protocol. Handout and site map will be mailed to her.  I rechecked her ins benefits and reviewed surgery prepayment amount with her.   Packet will be mailed.

## 2020-06-20 ENCOUNTER — Other Ambulatory Visit (HOSPITAL_COMMUNITY)
Admission: RE | Admit: 2020-06-20 | Discharge: 2020-06-20 | Disposition: A | Discharge status: Home / Self Care | Payer: BC Managed Care – PPO | Source: Ambulatory Visit | Attending: Obstetrics and Gynecology | Admitting: Obstetrics and Gynecology

## 2020-06-20 DIAGNOSIS — Z20822 Contact with and (suspected) exposure to covid-19: Secondary | ICD-10-CM | POA: Diagnosis not present

## 2020-06-20 DIAGNOSIS — N939 Abnormal uterine and vaginal bleeding, unspecified: Secondary | ICD-10-CM | POA: Diagnosis not present

## 2020-06-20 DIAGNOSIS — Z9884 Bariatric surgery status: Secondary | ICD-10-CM | POA: Diagnosis not present

## 2020-06-20 DIAGNOSIS — Z01812 Encounter for preprocedural laboratory examination: Secondary | ICD-10-CM | POA: Diagnosis not present

## 2020-06-20 DIAGNOSIS — Z886 Allergy status to analgesic agent status: Secondary | ICD-10-CM | POA: Diagnosis not present

## 2020-06-20 DIAGNOSIS — Z79890 Hormone replacement therapy (postmenopausal): Secondary | ICD-10-CM | POA: Diagnosis not present

## 2020-06-20 DIAGNOSIS — N840 Polyp of corpus uteri: Secondary | ICD-10-CM | POA: Diagnosis not present

## 2020-06-20 DIAGNOSIS — Z79899 Other long term (current) drug therapy: Secondary | ICD-10-CM | POA: Diagnosis not present

## 2020-06-20 DIAGNOSIS — Z8616 Personal history of COVID-19: Secondary | ICD-10-CM | POA: Diagnosis not present

## 2020-06-20 DIAGNOSIS — Z885 Allergy status to narcotic agent status: Secondary | ICD-10-CM | POA: Diagnosis not present

## 2020-06-20 DIAGNOSIS — Z85850 Personal history of malignant neoplasm of thyroid: Secondary | ICD-10-CM | POA: Diagnosis not present

## 2020-06-21 ENCOUNTER — Other Ambulatory Visit: Payer: Self-pay

## 2020-06-21 ENCOUNTER — Encounter (HOSPITAL_BASED_OUTPATIENT_CLINIC_OR_DEPARTMENT_OTHER): Payer: Self-pay | Admitting: Obstetrics and Gynecology

## 2020-06-21 LAB — SARS CORONAVIRUS 2 (TAT 6-24 HRS): SARS Coronavirus 2: NEGATIVE

## 2020-06-21 NOTE — Progress Notes (Signed)
Spoke w/ via phone for pre-op interview--- PT Lab needs dos---- Istat and EKG (per anes)/  Pre-op orders pending              Lab results------ no COVID test ------ done 06-20-2020 negative result in epic Arrive at ------- 0630 NPO after MN NO Solid Food.  Clear liquids from MN until--- 0530 Medications to take morning of surgery ----- Synthroid Diabetic medication ----- n/a Patient Special Instructions ----- n/a Pre-Op special Istructions ----- n/a Patient verbalized understanding of instructions that were given at this phone interview. Patient denies shortness of breath, chest pain, fever, cough at this phone interview.   Anesthesia : HTN;  Hx Thyroid cancer s/p total thyroidectomy and RAI 2019;    PCP:  Thayer Ohm PA (lov per pt 12/ 2021) Cardiologist : no Endocrinologist:  Dr Buddy Duty (lov per pt 12/ 2021) Chest x-ray : 06-21-2019 epic EKG :  06-21-2019 epic Echo :  no Stress test:  no Cardiac Cath :  no Activity level:  Denies sob w/ any activity Sleep Study/ CPAP :  NO Fasting Blood Sugar :      / Checks Blood Sugar -- times a day:  N/A Blood Thinner/ Instructions /Last Dose: NO ASA / Instructions/ Last Dose : NO

## 2020-06-23 ENCOUNTER — Encounter (HOSPITAL_BASED_OUTPATIENT_CLINIC_OR_DEPARTMENT_OTHER): Payer: Self-pay | Admitting: Obstetrics and Gynecology

## 2020-06-23 ENCOUNTER — Encounter (HOSPITAL_BASED_OUTPATIENT_CLINIC_OR_DEPARTMENT_OTHER)
Admission: RE | Disposition: A | Discharge status: Home / Self Care | Payer: Self-pay | Source: Home / Self Care | Attending: Obstetrics and Gynecology

## 2020-06-23 ENCOUNTER — Ambulatory Visit (HOSPITAL_BASED_OUTPATIENT_CLINIC_OR_DEPARTMENT_OTHER)
Admission: RE | Admit: 2020-06-23 | Discharge: 2020-06-23 | Disposition: A | Discharge status: Home / Self Care | Payer: BC Managed Care – PPO | Attending: Obstetrics and Gynecology | Admitting: Obstetrics and Gynecology

## 2020-06-23 ENCOUNTER — Ambulatory Visit (HOSPITAL_BASED_OUTPATIENT_CLINIC_OR_DEPARTMENT_OTHER): Payer: BC Managed Care – PPO | Admitting: Anesthesiology

## 2020-06-23 DIAGNOSIS — Z9884 Bariatric surgery status: Secondary | ICD-10-CM | POA: Insufficient documentation

## 2020-06-23 DIAGNOSIS — Z885 Allergy status to narcotic agent status: Secondary | ICD-10-CM | POA: Insufficient documentation

## 2020-06-23 DIAGNOSIS — Z01812 Encounter for preprocedural laboratory examination: Secondary | ICD-10-CM | POA: Diagnosis not present

## 2020-06-23 DIAGNOSIS — Z85850 Personal history of malignant neoplasm of thyroid: Secondary | ICD-10-CM | POA: Insufficient documentation

## 2020-06-23 DIAGNOSIS — N840 Polyp of corpus uteri: Secondary | ICD-10-CM | POA: Insufficient documentation

## 2020-06-23 DIAGNOSIS — Z8616 Personal history of COVID-19: Secondary | ICD-10-CM | POA: Diagnosis not present

## 2020-06-23 DIAGNOSIS — Z79899 Other long term (current) drug therapy: Secondary | ICD-10-CM | POA: Diagnosis not present

## 2020-06-23 DIAGNOSIS — Z79890 Hormone replacement therapy (postmenopausal): Secondary | ICD-10-CM | POA: Diagnosis not present

## 2020-06-23 DIAGNOSIS — Z886 Allergy status to analgesic agent status: Secondary | ICD-10-CM | POA: Diagnosis not present

## 2020-06-23 DIAGNOSIS — Z20822 Contact with and (suspected) exposure to covid-19: Secondary | ICD-10-CM | POA: Insufficient documentation

## 2020-06-23 DIAGNOSIS — N939 Abnormal uterine and vaginal bleeding, unspecified: Secondary | ICD-10-CM | POA: Diagnosis not present

## 2020-06-23 DIAGNOSIS — I10 Essential (primary) hypertension: Secondary | ICD-10-CM | POA: Diagnosis not present

## 2020-06-23 DIAGNOSIS — E042 Nontoxic multinodular goiter: Secondary | ICD-10-CM | POA: Diagnosis not present

## 2020-06-23 DIAGNOSIS — E039 Hypothyroidism, unspecified: Secondary | ICD-10-CM | POA: Diagnosis not present

## 2020-06-23 HISTORY — PX: DILATATION & CURETTAGE/HYSTEROSCOPY WITH MYOSURE: SHX6511

## 2020-06-23 HISTORY — DX: Abnormal uterine and vaginal bleeding, unspecified: N93.9

## 2020-06-23 LAB — POCT I-STAT, CHEM 8
BUN: 13 mg/dL (ref 6–20)
Calcium, Ion: 1.22 mmol/L (ref 1.15–1.40)
Chloride: 102 mmol/L (ref 98–111)
Creatinine, Ser: 0.5 mg/dL (ref 0.44–1.00)
Glucose, Bld: 90 mg/dL (ref 70–99)
HCT: 39 % (ref 36.0–46.0)
Hemoglobin: 13.3 g/dL (ref 12.0–15.0)
Potassium: 3.5 mmol/L (ref 3.5–5.1)
Sodium: 141 mmol/L (ref 135–145)
TCO2: 26 mmol/L (ref 22–32)

## 2020-06-23 LAB — TYPE AND SCREEN
ABO/RH(D): O NEG
Antibody Screen: NEGATIVE

## 2020-06-23 SURGERY — DILATATION & CURETTAGE/HYSTEROSCOPY WITH MYOSURE
Anesthesia: General | Site: Uterus

## 2020-06-23 MED ORDER — ACETAMINOPHEN 500 MG PO TABS
ORAL_TABLET | ORAL | Status: AC
  Filled 2020-06-23: qty 2

## 2020-06-23 MED ORDER — MIDAZOLAM HCL 2 MG/2ML IJ SOLN
INTRAMUSCULAR | Status: AC
  Filled 2020-06-23: qty 2

## 2020-06-23 MED ORDER — LIDOCAINE HCL (PF) 2 % IJ SOLN
INTRAMUSCULAR | Status: AC
  Filled 2020-06-23: qty 5

## 2020-06-23 MED ORDER — SODIUM CHLORIDE 0.9 % IR SOLN
Status: DC | PRN
  Administered 2020-06-23: 3000 mL

## 2020-06-23 MED ORDER — PROPOFOL 10 MG/ML IV BOLUS
INTRAVENOUS | Status: DC | PRN
  Administered 2020-06-23: 150 mg via INTRAVENOUS
  Administered 2020-06-23: 50 mg via INTRAVENOUS

## 2020-06-23 MED ORDER — PROMETHAZINE HCL 25 MG/ML IJ SOLN: 6.2500 mg | INTRAMUSCULAR | Status: DC | PRN

## 2020-06-23 MED ORDER — ACETAMINOPHEN 500 MG PO TABS
1000.0000 mg | ORAL_TABLET | Freq: Once | ORAL | Status: AC
  Administered 2020-06-23: 1000 mg via ORAL

## 2020-06-23 MED ORDER — DEXAMETHASONE SODIUM PHOSPHATE 10 MG/ML IJ SOLN
INTRAMUSCULAR | Status: DC | PRN
  Administered 2020-06-23: 5 mg via INTRAVENOUS

## 2020-06-23 MED ORDER — SCOPOLAMINE 1 MG/3DAYS TD PT72
MEDICATED_PATCH | TRANSDERMAL | Status: AC
  Filled 2020-06-23: qty 1

## 2020-06-23 MED ORDER — MIDAZOLAM HCL 5 MG/5ML IJ SOLN
INTRAMUSCULAR | Status: DC | PRN
  Administered 2020-06-23: 2 mg via INTRAVENOUS

## 2020-06-23 MED ORDER — LACTATED RINGERS IV SOLN: INTRAVENOUS | Status: DC

## 2020-06-23 MED ORDER — OXYCODONE HCL 5 MG/5ML PO SOLN: 5.0000 mg | Freq: Once | ORAL | Status: DC | PRN

## 2020-06-23 MED ORDER — KETOROLAC TROMETHAMINE 30 MG/ML IJ SOLN
INTRAMUSCULAR | Status: AC
  Filled 2020-06-23: qty 1

## 2020-06-23 MED ORDER — DEXMEDETOMIDINE (PRECEDEX) IN NS 20 MCG/5ML (4 MCG/ML) IV SYRINGE
PREFILLED_SYRINGE | INTRAVENOUS | Status: DC | PRN
  Administered 2020-06-23: 4 ug via INTRAVENOUS

## 2020-06-23 MED ORDER — LIDOCAINE 2% (20 MG/ML) 5 ML SYRINGE
INTRAMUSCULAR | Status: DC | PRN
  Administered 2020-06-23: 80 mg via INTRAVENOUS

## 2020-06-23 MED ORDER — KETOROLAC TROMETHAMINE 30 MG/ML IJ SOLN
INTRAMUSCULAR | Status: DC | PRN
  Administered 2020-06-23: 30 mg via INTRAVENOUS

## 2020-06-23 MED ORDER — AMISULPRIDE (ANTIEMETIC) 5 MG/2ML IV SOLN: 10.0000 mg | Freq: Once | INTRAVENOUS | Status: DC | PRN

## 2020-06-23 MED ORDER — ACETAMINOPHEN 500 MG PO TABS: 1000.0000 mg | ORAL_TABLET | Freq: Four times a day (QID) | ORAL | Status: AC | PRN

## 2020-06-23 MED ORDER — DEXAMETHASONE SODIUM PHOSPHATE 10 MG/ML IJ SOLN
INTRAMUSCULAR | Status: AC
  Filled 2020-06-23: qty 1

## 2020-06-23 MED ORDER — PROPOFOL 10 MG/ML IV BOLUS
INTRAVENOUS | Status: AC
  Filled 2020-06-23: qty 20

## 2020-06-23 MED ORDER — OXYCODONE HCL 5 MG PO TABS: 5.0000 mg | ORAL_TABLET | Freq: Once | ORAL | Status: DC | PRN

## 2020-06-23 MED ORDER — FENTANYL CITRATE (PF) 100 MCG/2ML IJ SOLN
INTRAMUSCULAR | Status: DC | PRN
  Administered 2020-06-23: 50 ug via INTRAVENOUS

## 2020-06-23 MED ORDER — ONDANSETRON HCL 4 MG/2ML IJ SOLN
INTRAMUSCULAR | Status: AC
  Filled 2020-06-23: qty 2

## 2020-06-23 MED ORDER — FENTANYL CITRATE (PF) 100 MCG/2ML IJ SOLN
INTRAMUSCULAR | Status: AC
  Filled 2020-06-23: qty 2

## 2020-06-23 MED ORDER — SCOPOLAMINE 1 MG/3DAYS TD PT72
1.0000 | MEDICATED_PATCH | TRANSDERMAL | Status: DC
  Administered 2020-06-23: 1.5 mg via TRANSDERMAL

## 2020-06-23 MED ORDER — ONDANSETRON HCL 4 MG/2ML IJ SOLN
INTRAMUSCULAR | Status: DC | PRN
  Administered 2020-06-23 (×2): 4 mg via INTRAVENOUS

## 2020-06-23 MED ORDER — DEXMEDETOMIDINE (PRECEDEX) IN NS 20 MCG/5ML (4 MCG/ML) IV SYRINGE
PREFILLED_SYRINGE | INTRAVENOUS | Status: AC
  Filled 2020-06-23: qty 5

## 2020-06-23 MED ORDER — BUPIVACAINE HCL (PF) 0.25 % IJ SOLN
INTRAMUSCULAR | Status: DC | PRN
  Administered 2020-06-23: 10 mL

## 2020-06-23 SURGICAL SUPPLY — 22 items
CATH ROBINSON RED A/P 16FR (CATHETERS) ×2 IMPLANT
COVER WAND RF STERILE (DRAPES) ×2 IMPLANT
DEVICE MYOSURE LITE (MISCELLANEOUS) IMPLANT
DEVICE MYOSURE REACH (MISCELLANEOUS) IMPLANT
DILATOR CANAL MILEX (MISCELLANEOUS) IMPLANT
ELECT REM PT RETURN 9FT ADLT (ELECTROSURGICAL)
ELECTRODE REM PT RTRN 9FT ADLT (ELECTROSURGICAL) IMPLANT
GAUZE 4X4 16PLY RFD (DISPOSABLE) ×2 IMPLANT
GLOVE BIO SURGEON STRL SZ8 (GLOVE) ×2 IMPLANT
GLOVE SRG 8 PF TXTR STRL LF DI (GLOVE) ×2 IMPLANT
GLOVE SURG UNDER POLY LF SZ8 (GLOVE) ×4
GOWN STRL REUS W/TWL XL LVL3 (GOWN DISPOSABLE) ×2 IMPLANT
IV NS IRRIG 3000ML ARTHROMATIC (IV SOLUTION) ×3 IMPLANT
KIT PROCEDURE FLUENT (KITS) ×2 IMPLANT
KIT TURNOVER CYSTO (KITS) ×2 IMPLANT
MYOSURE XL FIBROID (MISCELLANEOUS)
PACK VAGINAL MINOR WOMEN LF (CUSTOM PROCEDURE TRAY) ×2 IMPLANT
PAD OB MATERNITY 4.3X12.25 (PERSONAL CARE ITEMS) ×2 IMPLANT
PAD PREP 24X48 CUFFED NSTRL (MISCELLANEOUS) ×2 IMPLANT
SEAL CERVICAL OMNI LOK (ABLATOR) IMPLANT
SEAL ROD LENS SCOPE MYOSURE (ABLATOR) ×2 IMPLANT
SYSTEM TISS REMOVAL MYOSURE XL (MISCELLANEOUS) IMPLANT

## 2020-06-23 NOTE — Anesthesia Postprocedure Evaluation (Signed)
Anesthesia Post Note  Patient: Stephanie [name]  Procedure(s) Performed: DILATATION & CURETTAGE/HYSTEROSCOPY WITH  MYOSURE/POLYPECTOMY (N/A Uterus)     Patient location during evaluation: PACU Anesthesia Type: General Level of consciousness: awake and alert, oriented and patient cooperative Pain management: pain level controlled Vital Signs Assessment: post-procedure vital signs reviewed and stable Respiratory status: spontaneous breathing, nonlabored ventilation and respiratory function stable Cardiovascular status: blood pressure returned to baseline and stable Postop Assessment: no apparent nausea or vomiting Anesthetic complications: no   No complications documented.  Last Vitals:  Vitals:   06/23/20 0908 06/23/20 0915  BP: 114/83 122/89  Pulse: 72 67  Resp: 16 15  Temp: (!) 36.4 C   SpO2: 95% 97%    Last Pain:  Vitals:   06/23/20 0908  TempSrc:   PainSc: [place]

## 2020-06-23 NOTE — Discharge Instructions (Signed)

## 2020-06-23 NOTE — Anesthesia Preprocedure Evaluation (Addendum)
Anesthesia Evaluation  Patient identified by MRN, date of birth, ID band Patient awake    Reviewed: Allergy & Precautions, H&P , NPO status , Patient's Chart, lab work & pertinent test results  History of Anesthesia Complications (+) PONV and history of anesthetic complications  Airway Mallampati: II  TM Distance: >3 FB Neck ROM: Full    Dental no notable dental hx. (+) Teeth Intact, Dental Advisory Given   Pulmonary  covid Jan 2021   Pulmonary exam normal breath sounds clear to auscultation       Cardiovascular hypertension, Pt. on medications Normal cardiovascular exam Rhythm:Regular Rate:Normal     Neuro/Psych  Headaches, negative psych ROS   GI/Hepatic Neg liver ROS, hiatal hernia (repaired), S/p gastric bypass   Endo/Other  Hypothyroidism Obesity BMI 36 Hx thyroid ca s/p radioactive iodine   Renal/GU negative Renal ROS  Female GU complaint (PCOS, AUB, endometrial polyp)     Musculoskeletal negative musculoskeletal ROS (+)   Abdominal (+) + obese,   Peds negative pediatric ROS (+)  Hematology negative hematology ROS (+)   Anesthesia Other Findings   Reproductive/Obstetrics negative OB ROS                            Anesthesia Physical Anesthesia Plan  ASA: III  Anesthesia Plan: General   Post-op Pain Management:    Induction: Intravenous  PONV Risk Score and Plan: 4 or greater and Ondansetron, Dexamethasone, Propofol infusion, TIVA, Midazolam, Scopolamine patch - Pre-op, Treatment may vary due to age or medical condition and Diphenhydramine  Airway Management Planned: LMA  Additional Equipment: None  Intra-op Plan:   Post-operative Plan: Extubation in OR  Informed Consent: I have reviewed the patients History and Physical, chart, labs and discussed the procedure including the risks, benefits and alternatives for the proposed anesthesia with the patient or authorized  representative who has indicated his/her understanding and acceptance.     Dental advisory given  Plan Discussed with: CRNA  Anesthesia Plan Comments: (Did well with last anesthetic- inhalational, fentanyl, toradol)       Anesthesia Quick Evaluation

## 2020-06-23 NOTE — H&P (Signed)
Stephanie [name] 08-05-1960 MRN: [id number]  HPI The patient is a 60 y.o. G1P0001 who presents today for scheduled hysteroscopy, dilation and curettage for abnormal uterine bleeding and suspected endometrial polyp identified on recent sonohystogram.  She did have a D&C in 2020 with removal of a benign polyp.  She has not had any bleeding since the fall when we last saw her.  No other changes to her medical history since her pre op exam, denies CP, SOB, fever/chills, dysuria.  Past Medical History:  Diagnosis Date  . Abnormal uterine bleeding (AUB)   . Endometrial polyp   . Headache   . Herpes genitalia   . History of 2019 novel coronavirus disease (COVID-19) 06/06/2019   results in epic,  mild to moderate symptoms , resolved  . History of hiatal hernia    s/p  repair with gastric sleeve 05-14-2016  . 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 Hypertension    followed by pcp  . Polycystic ovarian syndrome   . PONV (postoperative nausea and vomiting)   . Post-surgical hypothyroidism    endocrinologist--- dr Buddy Duty  . 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 Uterine fibroid   . Wears glasses    Past Surgical History:  Procedure Laterality Date  . BREAST REDUCTION SURGERY  1994  . CESAREAN SECTION  5/04  . CHOLECYSTECTOMY N/A 04/07/2013   Procedure: LAPAROSCOPIC CHOLECYSTECTOMY;  Surgeon: Adin Hector, MD;  Location: [facility];  Service: General;  Laterality: N/A;  . DILATATION & CURETTAGE/HYSTEROSCOPY WITH MYOSURE N/A 01/05/2019   Procedure: [place];  Surgeon: Anastasio Auerbach, MD;  Location: [place];  Service: Gynecology;  Laterality: N/A;  request to follow at 9:00am in [facility] block requests one hour  . HYSTEROSCOPY W/ RESECTION POLYP  09-08-2001    dr Carren Rang  @[facility]   . LAPAROSCOPIC GASTRIC SLEEVE RESECTION WITH HIATAL HERNIA REPAIR N/A 05/14/2016   Procedure: LAPAROSCOPIC GASTRIC SLEEVE RESECTION WITH POSSIBLE HIATAL HERNIA REPAIR, UPPER ENDO;  Surgeon: Johnathan Hausen, MD;  Location: [facility];  Service: General;  Laterality: N/A;  . LIVER BIOPSY N/A 04/07/2013   Procedure: LIVER BIOPSY;  Surgeon: Adin Hector, MD;  Location: [facility];  Service: General;  Laterality: N/A;  . MYOMECTOMY ABDOMINAL APPROACH  11/2000  . THYROIDECTOMY N/A 09/02/2017   Procedure: TOTAL THYROIDECTOMY;  Surgeon: Armandina Gemma, MD;  Location: [facility];  Service: General;  Laterality: N/A;    No current facility-administered medications on file prior to encounter.   Current Outpatient Medications on File Prior to Encounter  Medication Sig Dispense Refill  . acetaminophen (TYLENOL) 500 MG tablet Take 1 tablet (500 mg total) by mouth every 6 (six) hours as needed. 30 tablet 0  . benazepril (LOTENSIN) 20 MG tablet Take 1 tablet (20 mg total) by mouth daily. (Patient taking differently: Take 20 mg by mouth daily.) 90 tablet 1  . CALCIUM PO Take 1,000 mg by mouth daily.    Marland Kitchen levothyroxine (SYNTHROID) 150 MCG tablet Take 150 mcg by mouth daily before breakfast.    . Multiple Vitamin (MULTIVITAMIN WITH MINERALS) TABS tablet Take 1 tablet by mouth daily.    . Semaglutide-Weight Management (WEGOVY) 0.25 MG/0.5ML SOAJ Inject 2.4 mg into the skin once a week. Saturday's    . megestrol (MEGACE) 20 MG tablet  Take 1 tablet (20 mg total) by mouth daily. (Patient not taking: No sig reported) 30 tablet 0  . Potassium Chloride ER 20 MEQ TBCR Take one tablet by mouth 3 times today (Patient not taking: No sig reported) 3 tablet 0   Allergies  Allergen Reactions  . Dilaudid [Hydromorphone] Nausea And Vomiting    severe  . Fentanyl Nausea And Vomiting    severe  . Morphine And Related Other (See Comments)    headache  . Nsaids Other (See Comments)    Bariatric Patient       Physical  Exam   BP (!) 158/108   Pulse 71   Temp 97.6 F (36.4 C) (Oral)   Resp 16   Ht 5' 4.5" (1.638 m)   Wt 101.7 kg   LMP  (LMP Unknown)   SpO2 96%   BMI 37.89 kg/m    General: Pleasant female, no acute distress, alert and oriented CV: RRR, no murmurs Pulm: good respiratory effort, CTAB     Plan Proceed with hysteroscopy D&C as planned.  All questions answered and the patient agrees to proceed. Running hypertensive today - she will plan to follow up with her primary for post op blood pressure check.     Joseph Pierini, MD 06/23/20

## 2020-06-23 NOTE — Transfer of Care (Signed)
Immediate Anesthesia Transfer of Care Note  Patient: Stephanie [name]  Procedure(s) Performed: DILATATION & CURETTAGE/HYSTEROSCOPY WITH  MYOSURE/POLYPECTOMY (N/A Uterus)  Patient Location: PACU  Anesthesia Type:General  Level of Consciousness: drowsy and patient cooperative  Airway & Oxygen Therapy: Patient Spontanous Breathing and Patient connected to nasal cannula oxygen  Post-op Assessment: Report given to RN and Post -op Vital signs reviewed and stable  Post vital signs: Reviewed and stable  Last Vitals:  Vitals Value Taken Time  BP 114/83 06/23/20 0909  Temp    Pulse 76 06/23/20 0912  Resp 16 06/23/20 0912  SpO2 95 % 06/23/20 0912  Vitals shown include unvalidated device data.  Last Pain:  Vitals:   06/23/20 0709  TempSrc: Oral  PainSc: 0-No pain      Patients Stated Pain Goal: 5 (37/16/96 7893)  Complications: No complications documented.

## 2020-06-23 NOTE — Anesthesia Procedure Notes (Signed)
Procedure Name: LMA Insertion Date/Time: 06/23/2020 8:31 AM Performed by: Gwyndolyn Saxon, CRNA Pre-anesthesia Checklist: Patient identified, Emergency Drugs available, Suction available and Patient being monitored Patient Re-evaluated:Patient Re-evaluated prior to induction Oxygen Delivery Method: Circle system utilized Induction Type: IV induction Ventilation: Mask ventilation without difficulty LMA: LMA inserted LMA Size: 4.0 Number of attempts: 1 Airway Equipment and Method: Patient positioned with wedge pillow Placement Confirmation: positive ETCO2 and breath sounds checked- equal and bilateral Tube secured with: Tape Dental Injury: Teeth and Oropharynx as per pre-operative assessment

## 2020-06-23 NOTE — Op Note (Signed)
Name: Stephanie [name]  Age: 60 y.o.  Date of Birth: [birth year] Medical Record #: [id number]   Operative Note   Preoperative Diagnosis: Abnormal uterine bleeding, history of endometrial polyps, suspected recurrence of endometrial polyp Procedure: Hysteroscopy, Dilatation and Curettage, MyoSure Polypectomy Postoperative Diagnosis: Endometrial polyp recurrence confirmed Surgeon: Joseph Pierini, MD Estimated Blood Loss: 5 mL Anesthesia: General LMA, local with 0.25% bupivacaine (10 mL) Specimens: Endometrial curettings with polyp fragments Findings: Small fundal submucosal fibroid, several polyps, largest approximately 2 cm in size and attached to the midline fundal area.  The base of this larger polyp was denser and calcified tissue likely consistent with submucosal leiomyoma.  Smaller polyps located laterally including a tiny one at the right tubal ostium.  Thickened endometrium in lower uterine segment.  Visualization of bilateral tubal ostia.  Endocervical canal normal. Uterus sounds to 11 cm.  Hysteroscopic fluid deficit 130 mL (with fluid spillage on floor noted).  Complication: none. Date: 06/23/20     DESCRIPTION OF PROCEDURE:      Preoperative review of the procedure was completed with the patient prior to transport to the operating room including risks, benefits, and alternatives.  The patient's questions were answered and she agreed to proceed.    Under anesthesia, [name] was placed in the dorsolithotomy position with legs in yellowfin stirrups with SCDs applied and running.  A surgical team time out was performed to verify and agree on procedure and patient consent. A bimanual exam was performed.  The patient was prepped and draped in the usual sterile fashion.      Cervix was visualized with an open sided speculum.  A paracervical block was applied in the standard fashion using 0.25% Marcaine. The anterior cervix was grasped with a single-toothed tenaculum. The uterine  descensus was noted to be poor.  Cervix was gently dilated using a progressive series of Pratt dilators to size 19.  There was no concern for uterine or cervical perforation.  The uterine cavity was very gently sounded to establish a measurement of uterine cavity depth.  The MyoSure hysteroscope was introduced and the uterine cavity and ostia were visualized.  Findings were noted as above.  The MyoSure reach was then inserted into the hysteroscope and polypectomy was performed, removing all visible polyp tissue.  The base of the larger midline fundal polyp seemed to have denser calcified tissue more consistent with a submucosal fibroid and this was also resected with the MyoSure to a level flush with the surrounding endometrium.   This was accomplished without concern for uterine perforation.  A small sized curette was used to perform thorough curettage and was productive of a small amount of endometrial tissue.  The curettage was effective at achieving a uterine cry circumferentially.  The resected endometrial polyp sample was collected and sent to pathology with the endometrial curettings for review.  Bleeding was minimal at that point.  The tenaculum was removed from the cervix and the puncture sites were hemostatic.   Instrument and sponge counts were correct at the conclusion of the procedure. The patient tolerated the procedure well and was brought to the recovery room in stable condition.      Joseph Pierini, M.D., Cherlynn June

## 2020-06-26 ENCOUNTER — Encounter (HOSPITAL_BASED_OUTPATIENT_CLINIC_OR_DEPARTMENT_OTHER): Payer: Self-pay | Admitting: Obstetrics and Gynecology

## 2020-06-26 DIAGNOSIS — I10 Essential (primary) hypertension: Secondary | ICD-10-CM | POA: Diagnosis not present

## 2020-06-26 LAB — SURGICAL PATHOLOGY

## 2020-07-14 ENCOUNTER — Ambulatory Visit (INDEPENDENT_AMBULATORY_CARE_PROVIDER_SITE_OTHER): Payer: BC Managed Care – PPO | Admitting: Obstetrics and Gynecology

## 2020-07-14 ENCOUNTER — Encounter: Payer: Self-pay | Admitting: Obstetrics and Gynecology

## 2020-07-14 ENCOUNTER — Other Ambulatory Visit: Payer: Self-pay

## 2020-07-14 VITALS — BP 120/70 | HR 72 | Wt 223.0 lb

## 2020-07-14 DIAGNOSIS — E039 Hypothyroidism, unspecified: Secondary | ICD-10-CM | POA: Diagnosis not present

## 2020-07-14 DIAGNOSIS — Z9889 Other specified postprocedural states: Secondary | ICD-10-CM

## 2020-07-14 DIAGNOSIS — N840 Polyp of corpus uteri: Secondary | ICD-10-CM

## 2020-07-14 NOTE — Progress Notes (Signed)
   Stephanie [name] 04-06-1961 [id number]  [place] APPOINTMENT Chief Complaint  Patient presents with  . Office Visit    Post op     HISTORY OF PRESENT ILLNESS Stephanie [name] presents for routine 2 week post-operative follow up after a hysteroscopy, dilation and curettage, MyoSure polypectomy performed on 06/24/2020 for recurrence of postmenopausal bleeding.  During the hysteroscopy she was noted to have 2 small polyps and one larger fundal polyp.  Pathology indicated a benign endometrial polyp.  Of note, she had just had a hysteroscopy D&C with polypectomy 01/2019.  The patient is doing well with no concerns.  She denies vaginal bleeding or discharge. She is tolerating a normal diet.  Bowel and bladder function are normal.  OBJECTIVE  BP 120/70 (BP Location: Right Arm, Patient Position: Sitting, Cuff Size: Normal)   Pulse 72   Wt 223 lb (101.2 kg)   LMP  (LMP Unknown)   BMI 37.69 kg/m    PHYSICAL EXAM General:  Patient in no acute distress.   ASSESSMENT / PLAN  Routine 2 week post operative check.   The patient is doing well and meeting all postoperative milestones.  I reviewed the benign pathology report describing the endometrial polyp tissue.  I showed her some pictures taken during the hysteroscopy.  Clearly she has a propensity for endometrial polyp development.  We discussed weight loss would possibly reduce the risk of recurrence of polyp formation.  She will monitor for any further episodes of vaginal bleeding and will report that immediately.  If she were to develop recurrent polyp formation she may need to consider a hysterectomy in the future.  She may return to normal activities without restriction and resume normal gynecologic care at this point.  The patient is aware that I will only be at this practice until early March 2022 so she knows to make sure she requests follow-up as needed when I am no longer at the practice.    Joseph Pierini MD 07/14/20

## 2020-07-18 DIAGNOSIS — Z9884 Bariatric surgery status: Secondary | ICD-10-CM | POA: Diagnosis not present

## 2020-07-18 DIAGNOSIS — Z13220 Encounter for screening for lipoid disorders: Secondary | ICD-10-CM | POA: Diagnosis not present

## 2020-07-18 DIAGNOSIS — E559 Vitamin D deficiency, unspecified: Secondary | ICD-10-CM | POA: Diagnosis not present

## 2020-07-18 DIAGNOSIS — I10 Essential (primary) hypertension: Secondary | ICD-10-CM | POA: Diagnosis not present

## 2020-07-18 DIAGNOSIS — Z0000 Encounter for general adult medical examination without abnormal findings: Secondary | ICD-10-CM | POA: Diagnosis not present

## 2020-09-08 DIAGNOSIS — E039 Hypothyroidism, unspecified: Secondary | ICD-10-CM | POA: Diagnosis not present

## 2020-11-01 DIAGNOSIS — E039 Hypothyroidism, unspecified: Secondary | ICD-10-CM | POA: Diagnosis not present

## 2020-12-19 DIAGNOSIS — E039 Hypothyroidism, unspecified: Secondary | ICD-10-CM | POA: Diagnosis not present

## 2021-01-01 DIAGNOSIS — I10 Essential (primary) hypertension: Secondary | ICD-10-CM | POA: Diagnosis not present

## 2021-01-01 DIAGNOSIS — E669 Obesity, unspecified: Secondary | ICD-10-CM | POA: Diagnosis not present

## 2021-03-29 ENCOUNTER — Other Ambulatory Visit: Payer: Self-pay | Admitting: Physician Assistant

## 2021-03-29 DIAGNOSIS — Z1231 Encounter for screening mammogram for malignant neoplasm of breast: Secondary | ICD-10-CM

## 2021-04-06 DIAGNOSIS — Z23 Encounter for immunization: Secondary | ICD-10-CM | POA: Diagnosis not present

## 2021-05-03 ENCOUNTER — Ambulatory Visit: Payer: BC Managed Care – PPO

## 2021-05-11 DIAGNOSIS — E039 Hypothyroidism, unspecified: Secondary | ICD-10-CM | POA: Diagnosis not present

## 2021-05-11 DIAGNOSIS — Z85850 Personal history of malignant neoplasm of thyroid: Secondary | ICD-10-CM | POA: Diagnosis not present

## 2021-05-30 ENCOUNTER — Ambulatory Visit
Admission: RE | Admit: 2021-05-30 | Discharge: 2021-05-30 | Disposition: A | Discharge status: Home / Self Care | Payer: BC Managed Care – PPO | Source: Ambulatory Visit | Attending: Physician Assistant | Admitting: Physician Assistant

## 2021-05-30 DIAGNOSIS — Z1231 Encounter for screening mammogram for malignant neoplasm of breast: Secondary | ICD-10-CM | POA: Diagnosis not present

## 2021-07-02 DIAGNOSIS — E039 Hypothyroidism, unspecified: Secondary | ICD-10-CM | POA: Diagnosis not present

## 2021-07-02 DIAGNOSIS — Z85850 Personal history of malignant neoplasm of thyroid: Secondary | ICD-10-CM | POA: Diagnosis not present

## 2021-07-19 DIAGNOSIS — E7800 Pure hypercholesterolemia, unspecified: Secondary | ICD-10-CM | POA: Diagnosis not present

## 2021-07-19 DIAGNOSIS — Z8639 Personal history of other endocrine, nutritional and metabolic disease: Secondary | ICD-10-CM | POA: Diagnosis not present

## 2021-07-19 DIAGNOSIS — N840 Polyp of corpus uteri: Secondary | ICD-10-CM | POA: Diagnosis not present

## 2021-07-19 DIAGNOSIS — Z9884 Bariatric surgery status: Secondary | ICD-10-CM | POA: Diagnosis not present

## 2021-07-19 DIAGNOSIS — Z0000 Encounter for general adult medical examination without abnormal findings: Secondary | ICD-10-CM | POA: Diagnosis not present

## 2021-07-19 DIAGNOSIS — Z23 Encounter for immunization: Secondary | ICD-10-CM | POA: Diagnosis not present

## 2021-07-19 DIAGNOSIS — I10 Essential (primary) hypertension: Secondary | ICD-10-CM | POA: Diagnosis not present

## 2021-07-19 DIAGNOSIS — Z85850 Personal history of malignant neoplasm of thyroid: Secondary | ICD-10-CM | POA: Diagnosis not present

## 2021-07-19 DIAGNOSIS — Z9889 Other specified postprocedural states: Secondary | ICD-10-CM | POA: Diagnosis not present

## 2021-07-19 DIAGNOSIS — N926 Irregular menstruation, unspecified: Secondary | ICD-10-CM | POA: Diagnosis not present

## 2021-07-19 DIAGNOSIS — E039 Hypothyroidism, unspecified: Secondary | ICD-10-CM | POA: Diagnosis not present

## 2021-12-07 ENCOUNTER — Encounter (HOSPITAL_COMMUNITY): Payer: Self-pay | Admitting: *Deleted

## 2022-01-23 DIAGNOSIS — I10 Essential (primary) hypertension: Secondary | ICD-10-CM | POA: Diagnosis not present

## 2022-01-23 DIAGNOSIS — E039 Hypothyroidism, unspecified: Secondary | ICD-10-CM | POA: Diagnosis not present

## 2022-01-23 DIAGNOSIS — E559 Vitamin D deficiency, unspecified: Secondary | ICD-10-CM | POA: Diagnosis not present

## 2022-01-23 DIAGNOSIS — Z9884 Bariatric surgery status: Secondary | ICD-10-CM | POA: Diagnosis not present

## 2022-04-17 ENCOUNTER — Other Ambulatory Visit: Payer: Self-pay | Admitting: Physician Assistant

## 2022-04-17 DIAGNOSIS — Z1231 Encounter for screening mammogram for malignant neoplasm of breast: Secondary | ICD-10-CM

## 2022-05-21 DIAGNOSIS — M79661 Pain in right lower leg: Secondary | ICD-10-CM | POA: Diagnosis not present

## 2022-05-21 DIAGNOSIS — M79604 Pain in right leg: Secondary | ICD-10-CM | POA: Diagnosis not present

## 2022-05-21 DIAGNOSIS — I83893 Varicose veins of bilateral lower extremities with other complications: Secondary | ICD-10-CM | POA: Diagnosis not present

## 2022-06-13 ENCOUNTER — Ambulatory Visit: Payer: BC Managed Care – PPO

## 2022-06-26 DIAGNOSIS — E039 Hypothyroidism, unspecified: Secondary | ICD-10-CM | POA: Diagnosis not present

## 2022-06-26 DIAGNOSIS — Z85850 Personal history of malignant neoplasm of thyroid: Secondary | ICD-10-CM | POA: Diagnosis not present

## 2022-07-02 DIAGNOSIS — E039 Hypothyroidism, unspecified: Secondary | ICD-10-CM | POA: Diagnosis not present

## 2022-07-02 DIAGNOSIS — Z85850 Personal history of malignant neoplasm of thyroid: Secondary | ICD-10-CM | POA: Diagnosis not present

## 2022-07-22 DIAGNOSIS — Z0000 Encounter for general adult medical examination without abnormal findings: Secondary | ICD-10-CM | POA: Diagnosis not present

## 2022-07-22 DIAGNOSIS — E7800 Pure hypercholesterolemia, unspecified: Secondary | ICD-10-CM | POA: Diagnosis not present

## 2022-07-22 DIAGNOSIS — Z9884 Bariatric surgery status: Secondary | ICD-10-CM | POA: Diagnosis not present

## 2022-07-22 DIAGNOSIS — I10 Essential (primary) hypertension: Secondary | ICD-10-CM | POA: Diagnosis not present

## 2022-07-22 DIAGNOSIS — E039 Hypothyroidism, unspecified: Secondary | ICD-10-CM | POA: Diagnosis not present

## 2022-07-22 DIAGNOSIS — Z23 Encounter for immunization: Secondary | ICD-10-CM | POA: Diagnosis not present

## 2022-07-22 DIAGNOSIS — E669 Obesity, unspecified: Secondary | ICD-10-CM | POA: Diagnosis not present

## 2022-08-01 ENCOUNTER — Ambulatory Visit
Admission: RE | Admit: 2022-08-01 | Discharge: 2022-08-01 | Disposition: A | Discharge status: Home / Self Care | Payer: 59 | Source: Ambulatory Visit | Attending: Physician Assistant | Admitting: Physician Assistant

## 2022-08-01 DIAGNOSIS — Z1231 Encounter for screening mammogram for malignant neoplasm of breast: Secondary | ICD-10-CM

## 2022-08-26 DIAGNOSIS — Z85850 Personal history of malignant neoplasm of thyroid: Secondary | ICD-10-CM | POA: Diagnosis not present

## 2022-08-26 DIAGNOSIS — E039 Hypothyroidism, unspecified: Secondary | ICD-10-CM | POA: Diagnosis not present

## 2022-10-29 DIAGNOSIS — E039 Hypothyroidism, unspecified: Secondary | ICD-10-CM | POA: Diagnosis not present

## 2022-12-26 ENCOUNTER — Encounter (HOSPITAL_COMMUNITY): Payer: Self-pay | Admitting: *Deleted

## 2023-02-11 DIAGNOSIS — Z411 Encounter for cosmetic surgery: Secondary | ICD-10-CM | POA: Diagnosis not present

## 2023-02-11 DIAGNOSIS — L80 Vitiligo: Secondary | ICD-10-CM | POA: Diagnosis not present

## 2023-06-06 DIAGNOSIS — M25562 Pain in left knee: Secondary | ICD-10-CM | POA: Diagnosis not present

## 2023-06-13 DIAGNOSIS — M25562 Pain in left knee: Secondary | ICD-10-CM | POA: Diagnosis not present

## 2023-06-27 DIAGNOSIS — M25562 Pain in left knee: Secondary | ICD-10-CM | POA: Diagnosis not present

## 2023-07-03 DIAGNOSIS — D171 Benign lipomatous neoplasm of skin and subcutaneous tissue of trunk: Secondary | ICD-10-CM | POA: Diagnosis not present

## 2023-07-03 DIAGNOSIS — D239 Other benign neoplasm of skin, unspecified: Secondary | ICD-10-CM | POA: Diagnosis not present

## 2023-07-03 DIAGNOSIS — D2271 Melanocytic nevi of right lower limb, including hip: Secondary | ICD-10-CM | POA: Diagnosis not present

## 2023-07-03 DIAGNOSIS — D485 Neoplasm of uncertain behavior of skin: Secondary | ICD-10-CM | POA: Diagnosis not present

## 2023-07-03 DIAGNOSIS — L80 Vitiligo: Secondary | ICD-10-CM | POA: Diagnosis not present

## 2023-07-03 DIAGNOSIS — Z85850 Personal history of malignant neoplasm of thyroid: Secondary | ICD-10-CM | POA: Diagnosis not present

## 2023-07-03 DIAGNOSIS — L814 Other melanin hyperpigmentation: Secondary | ICD-10-CM | POA: Diagnosis not present

## 2023-07-03 DIAGNOSIS — E039 Hypothyroidism, unspecified: Secondary | ICD-10-CM | POA: Diagnosis not present

## 2023-07-03 DIAGNOSIS — L821 Other seborrheic keratosis: Secondary | ICD-10-CM | POA: Diagnosis not present

## 2023-07-03 DIAGNOSIS — L578 Other skin changes due to chronic exposure to nonionizing radiation: Secondary | ICD-10-CM | POA: Diagnosis not present

## 2023-07-03 DIAGNOSIS — D225 Melanocytic nevi of trunk: Secondary | ICD-10-CM | POA: Diagnosis not present

## 2023-07-08 DIAGNOSIS — Z85850 Personal history of malignant neoplasm of thyroid: Secondary | ICD-10-CM | POA: Diagnosis not present

## 2023-07-08 DIAGNOSIS — E039 Hypothyroidism, unspecified: Secondary | ICD-10-CM | POA: Diagnosis not present

## 2023-07-10 DIAGNOSIS — M2341 Loose body in knee, right knee: Secondary | ICD-10-CM | POA: Diagnosis not present

## 2023-07-10 DIAGNOSIS — S83281A Other tear of lateral meniscus, current injury, right knee, initial encounter: Secondary | ICD-10-CM | POA: Diagnosis not present

## 2023-07-10 DIAGNOSIS — M2242 Chondromalacia patellae, left knee: Secondary | ICD-10-CM | POA: Diagnosis not present

## 2023-07-10 DIAGNOSIS — M2342 Loose body in knee, left knee: Secondary | ICD-10-CM | POA: Diagnosis not present

## 2023-07-10 DIAGNOSIS — S83242A Other tear of medial meniscus, current injury, left knee, initial encounter: Secondary | ICD-10-CM | POA: Diagnosis not present

## 2023-07-10 DIAGNOSIS — S83282A Other tear of lateral meniscus, current injury, left knee, initial encounter: Secondary | ICD-10-CM | POA: Diagnosis not present

## 2023-07-10 DIAGNOSIS — G8918 Other acute postprocedural pain: Secondary | ICD-10-CM | POA: Diagnosis not present

## 2023-07-10 DIAGNOSIS — S83241A Other tear of medial meniscus, current injury, right knee, initial encounter: Secondary | ICD-10-CM | POA: Diagnosis not present

## 2023-07-14 DIAGNOSIS — M25662 Stiffness of left knee, not elsewhere classified: Secondary | ICD-10-CM | POA: Diagnosis not present

## 2023-07-14 DIAGNOSIS — M6281 Muscle weakness (generalized): Secondary | ICD-10-CM | POA: Diagnosis not present

## 2023-07-14 DIAGNOSIS — S83272D Complex tear of lateral meniscus, current injury, left knee, subsequent encounter: Secondary | ICD-10-CM | POA: Diagnosis not present

## 2023-07-14 DIAGNOSIS — S83232D Complex tear of medial meniscus, current injury, left knee, subsequent encounter: Secondary | ICD-10-CM | POA: Diagnosis not present

## 2023-07-14 DIAGNOSIS — R262 Difficulty in walking, not elsewhere classified: Secondary | ICD-10-CM | POA: Diagnosis not present

## 2023-07-15 ENCOUNTER — Ambulatory Visit (HOSPITAL_BASED_OUTPATIENT_CLINIC_OR_DEPARTMENT_OTHER): Admit: 2023-07-15 | Payer: 59 | Admitting: Orthopedic Surgery

## 2023-07-15 ENCOUNTER — Encounter (HOSPITAL_BASED_OUTPATIENT_CLINIC_OR_DEPARTMENT_OTHER): Payer: Self-pay

## 2023-07-15 SURGERY — KNEE ARTHROSCOPY WITH MEDIAL MENISECTOMY
Anesthesia: Choice | Site: Knee | Laterality: Left

## 2023-07-16 DIAGNOSIS — R262 Difficulty in walking, not elsewhere classified: Secondary | ICD-10-CM | POA: Diagnosis not present

## 2023-07-16 DIAGNOSIS — S83272D Complex tear of lateral meniscus, current injury, left knee, subsequent encounter: Secondary | ICD-10-CM | POA: Diagnosis not present

## 2023-07-16 DIAGNOSIS — M6281 Muscle weakness (generalized): Secondary | ICD-10-CM | POA: Diagnosis not present

## 2023-07-16 DIAGNOSIS — M25662 Stiffness of left knee, not elsewhere classified: Secondary | ICD-10-CM | POA: Diagnosis not present

## 2023-07-16 DIAGNOSIS — S83232D Complex tear of medial meniscus, current injury, left knee, subsequent encounter: Secondary | ICD-10-CM | POA: Diagnosis not present

## 2023-07-21 DIAGNOSIS — M6281 Muscle weakness (generalized): Secondary | ICD-10-CM | POA: Diagnosis not present

## 2023-07-21 DIAGNOSIS — M25662 Stiffness of left knee, not elsewhere classified: Secondary | ICD-10-CM | POA: Diagnosis not present

## 2023-07-21 DIAGNOSIS — S83232D Complex tear of medial meniscus, current injury, left knee, subsequent encounter: Secondary | ICD-10-CM | POA: Diagnosis not present

## 2023-07-21 DIAGNOSIS — S83272D Complex tear of lateral meniscus, current injury, left knee, subsequent encounter: Secondary | ICD-10-CM | POA: Diagnosis not present

## 2023-07-21 DIAGNOSIS — R262 Difficulty in walking, not elsewhere classified: Secondary | ICD-10-CM | POA: Diagnosis not present

## 2023-08-11 DIAGNOSIS — Z0000 Encounter for general adult medical examination without abnormal findings: Secondary | ICD-10-CM | POA: Diagnosis not present

## 2023-08-11 DIAGNOSIS — E669 Obesity, unspecified: Secondary | ICD-10-CM | POA: Diagnosis not present

## 2023-08-11 DIAGNOSIS — E7800 Pure hypercholesterolemia, unspecified: Secondary | ICD-10-CM | POA: Diagnosis not present

## 2023-08-11 DIAGNOSIS — Z9884 Bariatric surgery status: Secondary | ICD-10-CM | POA: Diagnosis not present

## 2023-08-11 DIAGNOSIS — Z8639 Personal history of other endocrine, nutritional and metabolic disease: Secondary | ICD-10-CM | POA: Diagnosis not present

## 2023-08-11 DIAGNOSIS — E039 Hypothyroidism, unspecified: Secondary | ICD-10-CM | POA: Diagnosis not present

## 2023-08-11 DIAGNOSIS — I10 Essential (primary) hypertension: Secondary | ICD-10-CM | POA: Diagnosis not present

## 2023-08-11 DIAGNOSIS — E559 Vitamin D deficiency, unspecified: Secondary | ICD-10-CM | POA: Diagnosis not present

## 2023-09-02 ENCOUNTER — Other Ambulatory Visit: Payer: Self-pay | Admitting: Physician Assistant

## 2023-09-02 DIAGNOSIS — Z1231 Encounter for screening mammogram for malignant neoplasm of breast: Secondary | ICD-10-CM

## 2023-09-08 DIAGNOSIS — I10 Essential (primary) hypertension: Secondary | ICD-10-CM | POA: Diagnosis not present

## 2023-09-11 DIAGNOSIS — D2271 Melanocytic nevi of right lower limb, including hip: Secondary | ICD-10-CM | POA: Diagnosis not present

## 2023-09-11 DIAGNOSIS — D2371 Other benign neoplasm of skin of right lower limb, including hip: Secondary | ICD-10-CM | POA: Diagnosis not present

## 2023-09-12 ENCOUNTER — Ambulatory Visit
Admission: RE | Admit: 2023-09-12 | Discharge: 2023-09-12 | Disposition: A | Discharge status: Home / Self Care | Source: Ambulatory Visit | Attending: Physician Assistant | Admitting: Physician Assistant

## 2023-09-12 DIAGNOSIS — Z1231 Encounter for screening mammogram for malignant neoplasm of breast: Secondary | ICD-10-CM

## 2023-09-26 DIAGNOSIS — W57XXXA Bitten or stung by nonvenomous insect and other nonvenomous arthropods, initial encounter: Secondary | ICD-10-CM | POA: Diagnosis not present

## 2023-09-26 DIAGNOSIS — H61891 Other specified disorders of right external ear: Secondary | ICD-10-CM | POA: Diagnosis not present

## 2023-12-11 ENCOUNTER — Encounter (HOSPITAL_COMMUNITY): Payer: Self-pay | Admitting: *Deleted

## 2024-04-19 ENCOUNTER — Encounter: Payer: Self-pay | Admitting: Obstetrics and Gynecology

## 2024-04-19 ENCOUNTER — Ambulatory Visit (INDEPENDENT_AMBULATORY_CARE_PROVIDER_SITE_OTHER): Admitting: Obstetrics and Gynecology

## 2024-04-19 ENCOUNTER — Other Ambulatory Visit (HOSPITAL_COMMUNITY)
Admission: RE | Admit: 2024-04-19 | Discharge: 2024-04-19 | Disposition: A | Discharge status: Home / Self Care | Source: Ambulatory Visit | Attending: Obstetrics and Gynecology | Admitting: Obstetrics and Gynecology

## 2024-04-19 VITALS — BP 116/78 | HR 81 | Ht 64.5 in | Wt 185.0 lb

## 2024-04-19 DIAGNOSIS — Z01419 Encounter for gynecological examination (general) (routine) without abnormal findings: Secondary | ICD-10-CM

## 2024-04-19 DIAGNOSIS — Z124 Encounter for screening for malignant neoplasm of cervix: Secondary | ICD-10-CM

## 2024-04-19 DIAGNOSIS — Z1331 Encounter for screening for depression: Secondary | ICD-10-CM | POA: Diagnosis not present

## 2024-04-19 DIAGNOSIS — N950 Postmenopausal bleeding: Secondary | ICD-10-CM

## 2024-04-19 NOTE — Progress Notes (Unsigned)
 63 y.o. G58P0001 Single Caucasian female here for annual exam. PMP but has been having some bleeding.  Mostly spotting but sometimes heavy bleeding. Has had two D&C's, states if she needs to get one again she wants to skip and just get hysterotomy. Wants to discuss HRT.   Had D &C in 2020 and 2022.    Final pathology reports showed benign endometrial polyps with each surgical pathology report.    Has vaginal bleeding once in a while, last occurred 3 weeks ago.  No pain.    States hx of fibroids.   See US  report from 2021 in Epic.   [facility] 11/04/18 - 16.2 FSH  07/29/21 - unable to see result at [facility] on her cell phone just now.    Retired from [employer].  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     PCP: Alys Schuyler HERO, PA   No LMP recorded (lmp unknown). Patient is postmenopausal.           Sexually active: No.  The current method of family planning is post menopausal status.    Menopausal hormone therapy:  n/a Exercising: Yes.    Pickleball 8hrs week, walking her dog  Smoker:  no  OB History  Gravida Para Term Preterm AB Living  1 1   0 1  SAB IAB Ectopic Multiple Live Births    0      # Outcome Date GA Lbr Len/2nd Weight Sex Type Anes PTL Lv  1 Para              HEALTH MAINTENANCE: Last 2 paps:  02/16/20 neg, 07/10/15 neg History of abnormal Pap or positive HPV:  no Mammogram:   09/12/23 Breast Density Cat B, BIRADS Cat 1 neg  Colonoscopy:  unsure date but knows she is not due yet, was on 10 year plan.  Bone Density:  n/a  Result  n/a   Immunization History  Administered Date(s) Administered   Influenza Split 01/02/2011, 03/05/2012   Influenza,inj,Quad PF,6+ Mos 03/05/2013, 03/10/2014, 02/16/2020   Moderna Covid-19 Fall Seasonal Vaccine 40yrs & older 02/17/2024   Td 08/01/2001   Tdap 05/13/2013   Zoster, Live 09/17/2013      reports that she has never smoked. She has never used smokeless tobacco. She reports that she does not currently use alcohol. She reports  that she does not use drugs.  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/   s/p RAI 11-20-2017 for residual remnant   Hypertension    followed by pcp   Polycystic ovarian syndrome    PONV (postoperative nausea and vomiting)    Post-surgical hypothyroidism    endocrinologist--- dr faythe   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   gastric sleev   Uterine fibroid    Wears glasses     Past Surgical History:  Procedure Laterality Date   BREAST REDUCTION SURGERY  1994   CESAREAN SECTION  5/04   CHOLECYSTECTOMY N/A 04/07/2013   Procedure: LAPAROSCOPIC CHOLECYSTECTOMY;  Surgeon: Elspeth KYM Schultze,  MD;  Location: [facility];  Service: General;  Laterality: N/A;   DILATATION & CURETTAGE/HYSTEROSCOPY WITH MYOSURE N/A 01/05/2019   Procedure: DILATATION & CURETTAGE/HYSTEROSCOPY WITH MYOSURE;  Surgeon: Rockney Evalene SQUIBB, MD;  Location: [facility];  Service: Gynecology;  Laterality: N/A;  request to follow at 9:00am in [facility] block requests one hour   DILATATION & CURETTAGE/HYSTEROSCOPY WITH MYOSURE N/A 06/23/2020   Procedure: DILATATION & CURETTAGE/HYSTEROSCOPY WITH  MYOSURE/POLYPECTOMY;  Surgeon: Arnaldo Purchase, MD;  Location: [facility];  Service: Gynecology;  Laterality: N/A;  request to follow in [place] Iqueue held time   HYSTEROSCOPY W/ RESECTION POLYP  09-08-2001   dr darina  @[facility]    LAPAROSCOPIC GASTRIC SLEEVE RESECTION WITH HIATAL HERNIA REPAIR N/A 05/14/2016   Procedure: LAPAROSCOPIC GASTRIC SLEEVE RESECTION WITH POSSIBLE  HIATAL HERNIA REPAIR, UPPER ENDO;  Surgeon: Donnice Lunger, MD;  Location: [facility];  Service: General;  Laterality: N/A;   LIVER BIOPSY N/A 04/07/2013   Procedure: LIVER BIOPSY;  Surgeon: Elspeth KYM Schultze, MD;  Location: [facility];  Service: General;  Laterality: N/A;   MYOMECTOMY ABDOMINAL APPROACH  11/2000   THYROIDECTOMY N/A 09/02/2017   Procedure: TOTAL THYROIDECTOMY;  Surgeon: Eletha Boas, MD;  Location: [facility];  Service: General;  Laterality: N/A;    Current Outpatient Medications  Medication Sig Dispense Refill   acetaminophen  (TYLENOL ) 500 MG tablet Take 2 tablets (1,000 mg total) by mouth every 6 (six) hours as needed for mild pain (cramps).     amLODipine  (NORVASC ) 5 MG tablet 1 tablet     benazepril  (LOTENSIN ) 20 MG tablet Take 1 tablet (20 mg total) by mouth daily. (Patient taking differently: Take 20 mg by mouth daily.) 90 tablet 1   Cholecalciferol 1.25 MG (50000 UT) capsule 1 capsule.     levothyroxine  (SYNTHROID ) 150 MCG tablet Take 150 mcg by mouth daily before breakfast.     Multiple Vitamin (MULTIVITAMIN WITH MINERALS) TABS tablet Take 1 tablet by mouth daily.     Oyster Shell Calcium  500 MG TABS 3 tablet with meals     ZEPBOUND 12.5 MG/0.5ML Pen      No current facility-administered medications for this visit.    ALLERGIES: Bupropion, Dilaudid  [hydromorphone ], Fentanyl , Morphine  and codeine , Naltrexone-bupropion hcl er, Nsaids, and Topiramate  Family History  Problem Relation Age of Onset   Cancer Mother        leukemia   Hypertension Father    Heart disease Maternal Grandmother    Heart disease Maternal Grandfather    Aneurysm Maternal Grandfather    Aneurysm Paternal Grandmother     Review of Systems  All other systems reviewed and are negative.   PHYSICAL EXAM:  BP 116/78 (BP Location: Left Arm, Patient Position: Sitting)   Pulse 81   Ht 5' 4.5 (1.638 m)   Wt 185 lb (83.9 kg)   LMP  (LMP Unknown)   SpO2 98%   BMI 31.26 kg/m     General appearance:  alert, cooperative and appears stated age Head: normocephalic, without obvious abnormality, atraumatic Neck: no adenopathy, supple, symmetrical, trachea midline and no masses. Lungs: clear to auscultation bilaterally Breasts: normal appearance, no masses or tenderness, No nipple retraction or dimpling, No nipple discharge or bleeding, No axillary adenopathy Heart: regular rate and rhythm Abdomen: soft, non-tender; no masses, no organomegaly Extremities: extremities normal, atraumatic, no cyanosis or edema Skin: skin color, texture, turgor normal. No rashes or lesions Lymph nodes: cervical, supraclavicular, and axillary nodes normal. Neurologic: grossly normal  Pelvic: External genitalia:  no lesions              No abnormal inguinal nodes palpated.              Urethra:  normal appearing urethra with no masses, tenderness or lesions              Bartholins and Skenes: normal                 Vagina: normal appearing vagina with normal color and discharge, no lesions              Cervix: no lesions.  Cervix is small and high in vaginal canal.               Pap taken: yes Bimanual Exam:  Uterus:  normal size, contour, position, consistency, mobility, non-tender              Adnexa: no mass, fullness, tenderness              Rectal exam: yes.  Confirms.              Anus:  normal sphincter tone, no lesions  Chaperone was present for exam:  Kari HERO, CMA  ASSESSMENT: Well woman visit with gynecologic exam. Postmenopausal bleeding.  Status post dilation and curettage x 2 for polyps.  Known fibroids. Status post myomectomy.  Cervical cancer screening.   Hx breast reduction. Hx HSV.  Hx gastric sleeve surgery. Hx thyroid  cancer.  Status post thyroidectomy.   PHQ-2-9: 0  PLAN: Mammogram screening discussed. Self breast awareness reviewed. Pap and HRV collected:  yes Guidelines for Calcium , Vitamin D , regular exercise program including cardiovascular and weight bearing  exercise. Medication refills:  NA FSH and estradiol.  Postmenopausal bleeding discussed and potential etiologies of polyp, precancer, and cancer.  Return for pelvic US  and possible endometrial biopsy.  HRT not recommended at this time.  Will complete evaluation for bleeding first.   Follow up:  also for yearly exam.

## 2024-04-19 NOTE — Patient Instructions (Signed)

## 2024-04-20 ENCOUNTER — Ambulatory Visit: Payer: Self-pay | Admitting: Obstetrics and Gynecology

## 2024-04-20 LAB — ESTRADIOL: Estradiol: <30 pg/mL

## 2024-04-20 LAB — CYTOLOGY - PAP
Comment: NEGATIVE
Diagnosis: NEGATIVE
High risk HPV: NEGATIVE

## 2024-04-20 LAB — FOLLICLE STIMULATING HORMONE: FSH: 74 m[IU]/mL

## 2024-04-22 ENCOUNTER — Other Ambulatory Visit

## 2024-04-22 DIAGNOSIS — N950 Postmenopausal bleeding: Secondary | ICD-10-CM

## 2024-04-28 ENCOUNTER — Ambulatory Visit: Admitting: Obstetrics and Gynecology

## 2024-04-28 ENCOUNTER — Encounter: Payer: Self-pay | Admitting: Obstetrics and Gynecology

## 2024-04-28 VITALS — BP 118/82 | HR 79

## 2024-04-28 DIAGNOSIS — N950 Postmenopausal bleeding: Secondary | ICD-10-CM

## 2024-04-28 DIAGNOSIS — R9389 Abnormal findings on diagnostic imaging of other specified body structures: Secondary | ICD-10-CM

## 2024-04-28 DIAGNOSIS — D219 Benign neoplasm of connective and other soft tissue, unspecified: Secondary | ICD-10-CM | POA: Diagnosis not present

## 2024-04-28 NOTE — Progress Notes (Signed)
 GYNECOLOGY  VISIT   HPI: 63 y.o.   Single  Caucasian female   G1P0001 with No LMP recorded (lmp unknown). Patient is postmenopausal.   here for: Endometrial biopsy for postmenopausal bleeding and thickened endometrium.   FSH 74 04/19/24.  Pelvic US  04/22/24: Uterus 9.54 x 6.15 x 4.23 cm.  Fibroids:  5.27 cm pedunculated, left; 2.64 cm intramural EMS 16.93 mm.  Avascular.  Left ovary 1.95 x 1.27 x 1.02 cm.  11.1 mm follicle. Right ovary 2.56 x 1.61 x 1.66 cm.   7.1 mm follicle No adnexal masses.  No free fluid.   Patient desires hysterectomy.  Has hx of dilation and curettage procedures and endometrial polyps.   GYNECOLOGIC HISTORY: No LMP recorded (lmp unknown). Patient is postmenopausal. 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/14/2016   Thyroid  nodule 05/24/2013   Steatohepatitis, 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/26/2013   Obesity (BMI 30-39.9) 04/26/2013   Cholelithiasis and acute cholecystitis s/p lap chole 04/07/2013 04/08/2013   Bronchospasm 12/09/2012   Dermatitis 12/09/2012   Acne 03/06/2012   Single cyst of left breast 04/23/2011   Allergy    Polycystic ovarian syndrome    Hypertension    Thyroid  disease    Herpes genitalia    Obesity     Past Medical History:  Diagnosis Date   Abnormal uterine bleeding (AUB)    Endometrial polyp    Headache    Herpes genitalia    History of 2019 novel coronavirus disease  (COVID-19) 06/06/2019   results in epic,  mild to moderate symptoms , resolved   History of hiatal hernia    s/p  repair with gastric sleeve 05-14-2016   History of thyroid  cancer    09-02-2017   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/   s/p RAI 11-20-2017 for residual remnant   Hypertension    followed by pcp   Polycystic ovarian syndrome    PONV (postoperative nausea and vomiting)    Post-surgical hypothyroidism    endocrinologist--- dr faythe   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   gastric sleev   Uterine fibroid    Wears glasses     Past Surgical History:  Procedure Laterality Date   BREAST REDUCTION SURGERY  1994   CESAREAN SECTION  10/2002   CHOLECYSTECTOMY N/A 04/07/2013   Procedure: LAPAROSCOPIC CHOLECYSTECTOMY;  Surgeon: Elspeth KYM Schultze, MD;  Location: [facility];  Service: General;  Laterality: N/A;   DILATATION & CURETTAGE/HYSTEROSCOPY WITH MYOSURE N/A 01/05/2019   Procedure: DILATATION & CURETTAGE/HYSTEROSCOPY WITH MYOSURE;  Surgeon: Rockney Evalene SQUIBB,  MD;  Location: [facility];  Service: Gynecology;  Laterality: N/A;  request to follow at 9:00am in [facility] block requests one hour   DILATATION & CURETTAGE/HYSTEROSCOPY WITH MYOSURE N/A 06/23/2020   Procedure: DILATATION & CURETTAGE/HYSTEROSCOPY WITH  MYOSURE/POLYPECTOMY;  Surgeon: Arnaldo Purchase, MD;  Location: [facility];  Service: Gynecology;  Laterality: N/A;  request to follow in [place] Iqueue held time   HYSTEROSCOPY W/ RESECTION POLYP  09-08-2001   dr darina  @[facility]    LAPAROSCOPIC GASTRIC SLEEVE RESECTION WITH HIATAL HERNIA REPAIR N/A 05/14/2016   Procedure: LAPAROSCOPIC GASTRIC SLEEVE RESECTION WITH POSSIBLE HIATAL HERNIA REPAIR, UPPER ENDO;  Surgeon: Donnice Lunger, MD;  Location: [facility];  Service: General;  Laterality: N/A;   LIVER BIOPSY N/A 04/07/2013   Procedure: LIVER BIOPSY;  Surgeon: Elspeth KYM Schultze, MD;  Location: [facility];  Service: General;  Laterality: N/A;   MYOMECTOMY ABDOMINAL APPROACH  11/2000   THYROIDECTOMY N/A 09/02/2017   Procedure: TOTAL THYROIDECTOMY;  Surgeon: Eletha Boas, MD;  Location: [facility];  Service: General;  Laterality: N/A;    Current Outpatient Medications  Medication Sig Dispense Refill   acetaminophen  (TYLENOL ) 500 MG tablet Take 2 tablets (1,000 mg total) by mouth every 6 (six) hours as needed for mild pain (cramps).     amLODipine  (NORVASC ) 5 MG tablet 1 tablet     benazepril  (LOTENSIN ) 20 MG tablet Take 1 tablet (20 mg total) by mouth daily. (Patient taking differently: Take 20 mg by mouth daily.) 90 tablet 1   Cholecalciferol 1.25 MG (50000 UT) capsule 1 capsule.     levothyroxine  (SYNTHROID ) 150 MCG tablet Take 150 mcg by mouth daily before breakfast.     Multiple Vitamin (MULTIVITAMIN WITH MINERALS) TABS tablet Take 1 tablet by mouth daily.     Oyster Shell Calcium  500 MG TABS 3 tablet with meals     ZEPBOUND 12.5 MG/0.5ML Pen      No current facility-administered medications for this visit.     ALLERGIES: Bupropion, Dilaudid  [hydromorphone ], Fentanyl , Morphine  and codeine , Naltrexone-bupropion hcl er, Nsaids, and Topiramate  Family History  Problem Relation Age of Onset   Cancer Mother        leukemia   Hypertension Father    Heart disease Maternal Grandmother    Heart disease Maternal Grandfather    Aneurysm Maternal Grandfather    Aneurysm Paternal Grandmother     Social History   Socioeconomic History   Marital status: Single    Spouse name: Not on file   Number of children: Not on file   Years of education: Not on file   Highest education level: Not on file  Occupational History   Not on file  Tobacco Use   Smoking status: Never   Smokeless tobacco: Never  Vaping Use   Vaping status: Never Used  Substance and Sexual Activity   Alcohol use: Not Currently   Drug use: No   Sexual activity: Not Currently    Partners: Male     Birth control/protection: None  Other Topics Concern   Not on file  Social History Narrative   Not on file   Social Drivers of Health   Financial Resource Strain: Not on file  Food Insecurity: Not on file  Transportation Needs: Not on file  Physical Activity: Not on file  Stress: Not on file  Social Connections: Not on file  Intimate Partner Violence: Not on file    Review of Systems  All other systems reviewed  and are negative.   PHYSICAL EXAMINATION:   BP 118/82 (BP Location: Left Arm, Patient Position: Sitting)   Pulse 79   LMP  (LMP Unknown)   SpO2 98%     General appearance: alert, cooperative and appears stated age   Pelvic: External genitalia:  no lesions              Urethra:  normal appearing urethra with no masses, tenderness or lesions              Bartholins and Skenes: normal                 Vagina: normal appearing vagina with normal color and discharge, no lesions              Cervix: small, short, high in vaginal vault.                 Bimanual Exam:  Uterus:  normal size, contour, position, consistency, mobility, non-tender              Adnexa: no mass, fullness, tenderness           Endometrial biopsy.  Consent and time out done.  Sterile prep with Hibiclens .  Local 1% lidocaine , lot [id number], exp Feb. 2028.  2.5 cc injected.   Unable to apply tenaculum due to small cervical size, high in the vaginal vault.  Procedure aborted.   No complications.  Minimal EBL.   Chaperone was present for exam:  Kari HERO, CMA  ASSESSMENT:  Postmenopausal bleeding.  Thickened endometrium.  Fibroids. Hx C/S.  PLAN:  Pelvic US  images and report reviewed.   Fibroids and thickened endometrium discussed with potential etiologies of polyp, hyperplasia, malignancy.  Referral to surgical colleague for hysteroscopy with dilation and curettage.   Patient would like to achieve hysterectomy soon after a dilation and curettage procedure.   30 min  total time was  spent for this patient encounter, including preparation, face-to-face counseling with the patient, coordination of care, and documentation of the encounter in addition to the attempted endometrial biopsy.

## 2024-05-03 ENCOUNTER — Encounter: Payer: Self-pay | Admitting: Obstetrics and Gynecology

## 2024-05-05 NOTE — Telephone Encounter (Signed)
 Spoke with patient to discuss surgery benefits and scheduling process. Advised once she is seen by Dr. Dallie and surgery referral received, I will reach out to schedule and business office will review benefits. Advised I could not guarantee surgery date before end of year, as surgery schedule is very limited.   Patient expressed frustration with benefits process. Patient states surgery is D&C as noted by Dr. Nikki, benefits should be checked regardless of provider. Advised surgery may stay as recommended by Dr. Nikki; however, this is determined at time of consult by you and the surgeon.  Patient expressed how inefficient process is, states she will need to find another provider and ended call.   Stephanie [name] also notified.  Routing FYI  Cc: Dr. Dallie

## 2024-05-18 ENCOUNTER — Encounter: Payer: Self-pay | Admitting: Obstetrics and Gynecology

## 2024-05-18 ENCOUNTER — Ambulatory Visit: Admitting: Obstetrics and Gynecology

## 2024-05-18 VITALS — BP 102/76 | HR 73 | Temp 97.5°F | Ht 64.75 in | Wt 185.0 lb

## 2024-05-18 DIAGNOSIS — D219 Benign neoplasm of connective and other soft tissue, unspecified: Secondary | ICD-10-CM

## 2024-05-18 DIAGNOSIS — N950 Postmenopausal bleeding: Secondary | ICD-10-CM

## 2024-05-18 DIAGNOSIS — R9389 Abnormal findings on diagnostic imaging of other specified body structures: Secondary | ICD-10-CM | POA: Diagnosis not present

## 2024-05-18 NOTE — H&P (View-Only) (Signed)
 63 y.o. G60P1001 female with PMB, thickended endometrium, hx of abdominal myomectomy via midline incision (2002) and CD x1 (Pfannenstiel, 2004), sleeve gastrectomy, prior thyroidectomy, HTN here for surgical consultation for PMB and thickened endometrium. Single. Retired. Referred by Dr. Nikki.  No LMP recorded (lmp unknown). Patient is postmenopausal.   She reports postmenopausal bleeding at her 04/19/24 annual exam. Her history is notable for dilation and curettage x 2 for polyps in 2020 and 2022, benign pathology. Known fibroids, s/p myomectomy 2002  Pelvic US  04/22/24: Uterus 9.54 x 6.15 x 4.23 cm.  Fibroids:  5.27 cm pedunculated, left; 2.64 cm intramural EMS 16.93 mm.  Avascular.  Left ovary 1.95 x 1.27 x 1.02 cm.  11.1 mm follicle. Right ovary 2.56 x 1.61 x 1.66 cm.   7.1 mm follicle No adnexal masses.  No free fluid.   She ultimately desires treatment for recurrent polyps in the form of hysterectomy. However an endometrial biopsy was attempted by Dr. Nikki on 04/20/2024 and cannot be completed due to habitus Unable to apply tenaculum due to small cervical size, high in the vaginal vault.  This anatomy suggests intraabdominal pathology in the form of fibroids or adhesions.  She is being referred for endometrial sampling via dx hysteroscopy, D&C followed by definitive surgical management via hysterectomy.  She plays pickle balls 5d/wk. No CP or SOB. Migraine with dilaudid   Last PAP:    Component Value Date/Time   DIAGPAP  04/19/2024 1620    - Negative for intraepithelial lesion or malignancy (NILM)   HPVHIGH Negative 04/19/2024 1620   ADEQPAP  04/19/2024 1620    Satisfactory for evaluation. The presence or absence of an   ADEQPAP  04/19/2024 1620    endocervical/transformation zone component cannot be determined because   ADEQPAP of atrophy. 04/19/2024 1620   Birth control: none Sexually active: no   GYN HISTORY: As noted  OB History  Gravida Para Term Preterm  AB Living  1 1 1   0 1  SAB IAB Ectopic Multiple Live Births    0  1    # Outcome Date GA Lbr Len/2nd Weight Sex Type Anes PTL Lv  1 Term      CS-Unspec   LIV    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/   s/p RAI 11-20-2017 for residual remnant   Hypertension    followed by pcp   Polycystic ovarian syndrome    PONV (postoperative nausea and vomiting)    Post-surgical hypothyroidism    endocrinologist--- dr faythe   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   gastric sleev   Uterine fibroid    Wears glasses     Past Surgical History:  Procedure Laterality Date   BREAST REDUCTION SURGERY  1994   CESAREAN SECTION  10/2002   CHOLECYSTECTOMY N/A 04/07/2013   Procedure: LAPAROSCOPIC CHOLECYSTECTOMY;  Surgeon: Elspeth KYM Schultze, MD;  Location: [facility];  Service: General;  Laterality: N/A;   DILATATION & CURETTAGE/HYSTEROSCOPY WITH MYOSURE N/A 01/05/2019   Procedure: DILATATION & CURETTAGE/HYSTEROSCOPY WITH  MYOSURE;  Surgeon: Rockney Evalene SQUIBB, MD;  Location: [facility];  Service: Gynecology;  Laterality: N/A;  request to follow at 9:00am in [facility] block requests one hour   DILATATION & CURETTAGE/HYSTEROSCOPY WITH MYOSURE N/A 06/23/2020   Procedure: DILATATION & CURETTAGE/HYSTEROSCOPY WITH  MYOSURE/POLYPECTOMY;  Surgeon: Arnaldo Purchase, MD;  Location: [facility];  Service: Gynecology;  Laterality: N/A;  request to follow in [place] Iqueue held time   HYSTEROSCOPY W/ RESECTION POLYP  09-08-2001   dr darina  @[facility]     LAPAROSCOPIC GASTRIC SLEEVE RESECTION WITH HIATAL HERNIA REPAIR N/A 05/14/2016   Procedure: LAPAROSCOPIC GASTRIC SLEEVE RESECTION WITH POSSIBLE HIATAL HERNIA REPAIR, UPPER ENDO;  Surgeon: Donnice Lunger, MD;  Location: [facility];  Service: General;  Laterality: N/A;   LIVER BIOPSY N/A 04/07/2013   Procedure: LIVER BIOPSY;  Surgeon: Elspeth KYM Schultze, MD;  Location: [facility];  Service: General;  Laterality: N/A;   MYOMECTOMY ABDOMINAL APPROACH  11/2000   THYROIDECTOMY N/A 09/02/2017   Procedure: TOTAL THYROIDECTOMY;  Surgeon: Eletha Boas, MD;  Location: [facility];  Service: General;  Laterality: N/A;    Medications Ordered Prior to Encounter[1]  Allergies[2]    PE Today's Vitals   05/18/24 1139  BP: 102/76  Pulse: 73  Temp: (!) 97.5 F (36.4 C)  TempSrc: Oral  SpO2: 99%  Weight: 185 lb (83.9 kg)  Height: 5' 4.75 (1.645 m)   Body mass index is 31.02 kg/m.  Physical Exam Vitals reviewed.  Constitutional:      General: She is not in acute distress.    Appearance: Normal appearance.  HENT:     Head: Normocephalic and atraumatic.     Nose: Nose normal.  Eyes:     Extraocular Movements: Extraocular movements intact.     Conjunctiva/sclera: Conjunctivae normal.  Cardiovascular:     Rate and Rhythm: Normal rate and regular rhythm.     Heart sounds: No murmur heard.    No friction rub. No gallop.  Pulmonary:     Effort: Pulmonary effort is normal. No respiratory distress.     Breath sounds: Normal breath sounds. No stridor. No wheezing, rhonchi or rales.  Abdominal:     General: There is no distension.     Palpations: Abdomen is soft.     Tenderness: There is no abdominal tenderness.      Comments: Prior incision sites  Musculoskeletal:        General: Normal range of motion.     Cervical back: Normal range of motion.  Neurological:     General: No focal deficit present.     Mental Status: She is alert.  Psychiatric:        Mood and Affect: Mood normal.        Behavior:  Behavior normal.       Assessment and Plan:        Postmenopausal bleeding -     Ambulatory Referral For Surgery Scheduling  Thickened endometrium -     Ambulatory Referral For Surgery Scheduling  Fibroids  Plan for hysteroscopy, dilation and curettage, polypectomy followed by interval RATLH, BS, cystoscopy if pathology is benign.  Discussed outpatient procedure. Reviewed that  recovery is usually 1-2 days. Risks including infections, bleeding, and damage to surrounding organs reviewed. Recommend NPO prior to midnight and reviewed medication to take on day of surgery. Dicussed use of NSAIDS as needed for pain postoperatively.  Preop checklist: Antibiotics: none DVT ppx: SCDs Postop visit: 1 week Additional clearance: none  We also discussed robotic assisted total laparoscopic hysterectomy, bilateral salpingectomy, cystoscopy. Discussed outpatient procedure. Reviewed that  recovery is usually 6 weeks with additional 4 weeks of pelvic rest. Risks including infections, bleeding, and damage to surrounding organs reviewed.  Patient is agreeable to blood transfusion in the event of an emergency.  Patient in agreement with initial plan. RTO for preop visit. Will need surgical clearance from PCP. Tubal and hysterectomy papers signed: N/A  Vera LULLA Pa, MD      [1]  Current Outpatient Medications on File Prior to Visit  Medication Sig Dispense Refill   acetaminophen  (TYLENOL ) 500 MG tablet Take 2 tablets (1,000 mg total) by mouth every 6 (six) hours as needed for mild pain (cramps).     amLODipine  (NORVASC ) 5 MG tablet 1 tablet     benazepril  (LOTENSIN ) 20 MG tablet Take 1 tablet (20 mg total) by mouth daily. 90 tablet 1   Cholecalciferol 1.25 MG (50000 UT) capsule 1 capsule.     levothyroxine  (SYNTHROID ) 125 MCG tablet Take 125 mcg by mouth daily.     Multiple Vitamin (MULTIVITAMIN WITH MINERALS) TABS tablet Take 1 tablet by mouth daily.     Oyster Shell Calcium  500 MG TABS 3  tablet with meals     ZEPBOUND 12.5 MG/0.5ML Pen      No current facility-administered medications on file prior to visit.  [2]  Allergies Allergen Reactions   Bupropion     Other Reaction(s): BP elevation   Dilaudid  [Hydromorphone ] Nausea And Vomiting    severe   Fentanyl  Nausea And Vomiting    severe   Morphine  And Codeine  Other (See Comments)    headache   Naltrexone-Bupropion Hcl Er Nausea Only   Nsaids Other (See Comments)    Bariatric Patient    Topiramate     Other Reaction(s): BP elevation

## 2024-05-18 NOTE — Addendum Note (Signed)
 Addended by: DALLIE BOLLARD V on: 05/18/2024 01:01 PM   Modules accepted: Orders

## 2024-05-18 NOTE — Progress Notes (Signed)
 63 y.o. G60P1001 female with PMB, thickended endometrium, hx of abdominal myomectomy via midline incision (2002) and CD x1 (Pfannenstiel, 2004), sleeve gastrectomy, prior thyroidectomy, HTN here for surgical consultation for PMB and thickened endometrium. Single. Retired. Referred by Dr. Nikki.  No LMP recorded (lmp unknown). Patient is postmenopausal.   She reports postmenopausal bleeding at her 04/19/24 annual exam. Her history is notable for dilation and curettage x 2 for polyps in 2020 and 2022, benign pathology. Known fibroids, s/p myomectomy 2002  Pelvic US  04/22/24: Uterus 9.54 x 6.15 x 4.23 cm.  Fibroids:  5.27 cm pedunculated, left; 2.64 cm intramural EMS 16.93 mm.  Avascular.  Left ovary 1.95 x 1.27 x 1.02 cm.  11.1 mm follicle. Right ovary 2.56 x 1.61 x 1.66 cm.   7.1 mm follicle No adnexal masses.  No free fluid.   She ultimately desires treatment for recurrent polyps in the form of hysterectomy. However an endometrial biopsy was attempted by Dr. Nikki on 04/20/2024 and cannot be completed due to habitus Unable to apply tenaculum due to small cervical size, high in the vaginal vault.  This anatomy suggests intraabdominal pathology in the form of fibroids or adhesions.  She is being referred for endometrial sampling via dx hysteroscopy, D&C followed by definitive surgical management via hysterectomy.  She plays pickle balls 5d/wk. No CP or SOB. Migraine with dilaudid   Last PAP:    Component Value Date/Time   DIAGPAP  04/19/2024 1620    - Negative for intraepithelial lesion or malignancy (NILM)   HPVHIGH Negative 04/19/2024 1620   ADEQPAP  04/19/2024 1620    Satisfactory for evaluation. The presence or absence of an   ADEQPAP  04/19/2024 1620    endocervical/transformation zone component cannot be determined because   ADEQPAP of atrophy. 04/19/2024 1620   Birth control: none Sexually active: no   GYN HISTORY: As noted  OB History  Gravida Para Term Preterm  AB Living  1 1 1   0 1  SAB IAB Ectopic Multiple Live Births    0  1    # Outcome Date GA Lbr Len/2nd Weight Sex Type Anes PTL Lv  1 Term      CS-Unspec   LIV    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/   s/p RAI 11-20-2017 for residual remnant   Hypertension    followed by pcp   Polycystic ovarian syndrome    PONV (postoperative nausea and vomiting)    Post-surgical hypothyroidism    endocrinologist--- dr faythe   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   gastric sleev   Uterine fibroid    Wears glasses     Past Surgical History:  Procedure Laterality Date   BREAST REDUCTION SURGERY  1994   CESAREAN SECTION  10/2002   CHOLECYSTECTOMY N/A 04/07/2013   Procedure: LAPAROSCOPIC CHOLECYSTECTOMY;  Surgeon: Elspeth KYM Schultze, MD;  Location: [facility];  Service: General;  Laterality: N/A;   DILATATION & CURETTAGE/HYSTEROSCOPY WITH MYOSURE N/A 01/05/2019   Procedure: DILATATION & CURETTAGE/HYSTEROSCOPY WITH  MYOSURE;  Surgeon: Rockney Evalene SQUIBB, MD;  Location: [facility];  Service: Gynecology;  Laterality: N/A;  request to follow at 9:00am in [facility] block requests one hour   DILATATION & CURETTAGE/HYSTEROSCOPY WITH MYOSURE N/A 06/23/2020   Procedure: DILATATION & CURETTAGE/HYSTEROSCOPY WITH  MYOSURE/POLYPECTOMY;  Surgeon: Arnaldo Purchase, MD;  Location: [facility];  Service: Gynecology;  Laterality: N/A;  request to follow in [place] Iqueue held time   HYSTEROSCOPY W/ RESECTION POLYP  09-08-2001   dr darina  @[facility]     LAPAROSCOPIC GASTRIC SLEEVE RESECTION WITH HIATAL HERNIA REPAIR N/A 05/14/2016   Procedure: LAPAROSCOPIC GASTRIC SLEEVE RESECTION WITH POSSIBLE HIATAL HERNIA REPAIR, UPPER ENDO;  Surgeon: Donnice Lunger, MD;  Location: [facility];  Service: General;  Laterality: N/A;   LIVER BIOPSY N/A 04/07/2013   Procedure: LIVER BIOPSY;  Surgeon: Elspeth KYM Schultze, MD;  Location: [facility];  Service: General;  Laterality: N/A;   MYOMECTOMY ABDOMINAL APPROACH  11/2000   THYROIDECTOMY N/A 09/02/2017   Procedure: TOTAL THYROIDECTOMY;  Surgeon: Eletha Boas, MD;  Location: [facility];  Service: General;  Laterality: N/A;    Medications Ordered Prior to Encounter[1]  Allergies[2]    PE Today's Vitals   05/18/24 1139  BP: 102/76  Pulse: 73  Temp: (!) 97.5 F (36.4 C)  TempSrc: Oral  SpO2: 99%  Weight: 185 lb (83.9 kg)  Height: 5' 4.75 (1.645 m)   Body mass index is 31.02 kg/m.  Physical Exam Vitals reviewed.  Constitutional:      General: She is not in acute distress.    Appearance: Normal appearance.  HENT:     Head: Normocephalic and atraumatic.     Nose: Nose normal.  Eyes:     Extraocular Movements: Extraocular movements intact.     Conjunctiva/sclera: Conjunctivae normal.  Cardiovascular:     Rate and Rhythm: Normal rate and regular rhythm.     Heart sounds: No murmur heard.    No friction rub. No gallop.  Pulmonary:     Effort: Pulmonary effort is normal. No respiratory distress.     Breath sounds: Normal breath sounds. No stridor. No wheezing, rhonchi or rales.  Abdominal:     General: There is no distension.     Palpations: Abdomen is soft.     Tenderness: There is no abdominal tenderness.      Comments: Prior incision sites  Musculoskeletal:        General: Normal range of motion.     Cervical back: Normal range of motion.  Neurological:     General: No focal deficit present.     Mental Status: She is alert.  Psychiatric:        Mood and Affect: Mood normal.        Behavior:  Behavior normal.       Assessment and Plan:        Postmenopausal bleeding -     Ambulatory Referral For Surgery Scheduling  Thickened endometrium -     Ambulatory Referral For Surgery Scheduling  Fibroids  Plan for hysteroscopy, dilation and curettage, polypectomy followed by interval RATLH, BS, cystoscopy if pathology is benign.  Discussed outpatient procedure. Reviewed that  recovery is usually 1-2 days. Risks including infections, bleeding, and damage to surrounding organs reviewed. Recommend NPO prior to midnight and reviewed medication to take on day of surgery. Dicussed use of NSAIDS as needed for pain postoperatively.  Preop checklist: Antibiotics: none DVT ppx: SCDs Postop visit: 1 week Additional clearance: none  We also discussed robotic assisted total laparoscopic hysterectomy, bilateral salpingectomy, cystoscopy. Discussed outpatient procedure. Reviewed that  recovery is usually 6 weeks with additional 4 weeks of pelvic rest. Risks including infections, bleeding, and damage to surrounding organs reviewed.  Patient is agreeable to blood transfusion in the event of an emergency.  Patient in agreement with initial plan. RTO for preop visit. Will need surgical clearance from PCP. Tubal and hysterectomy papers signed: N/A  Vera LULLA Pa, MD      [1]  Current Outpatient Medications on File Prior to Visit  Medication Sig Dispense Refill   acetaminophen  (TYLENOL ) 500 MG tablet Take 2 tablets (1,000 mg total) by mouth every 6 (six) hours as needed for mild pain (cramps).     amLODipine  (NORVASC ) 5 MG tablet 1 tablet     benazepril  (LOTENSIN ) 20 MG tablet Take 1 tablet (20 mg total) by mouth daily. 90 tablet 1   Cholecalciferol 1.25 MG (50000 UT) capsule 1 capsule.     levothyroxine  (SYNTHROID ) 125 MCG tablet Take 125 mcg by mouth daily.     Multiple Vitamin (MULTIVITAMIN WITH MINERALS) TABS tablet Take 1 tablet by mouth daily.     Oyster Shell Calcium  500 MG TABS 3  tablet with meals     ZEPBOUND 12.5 MG/0.5ML Pen      No current facility-administered medications on file prior to visit.  [2]  Allergies Allergen Reactions   Bupropion     Other Reaction(s): BP elevation   Dilaudid  [Hydromorphone ] Nausea And Vomiting    severe   Fentanyl  Nausea And Vomiting    severe   Morphine  And Codeine  Other (See Comments)    headache   Naltrexone-Bupropion Hcl Er Nausea Only   Nsaids Other (See Comments)    Bariatric Patient    Topiramate     Other Reaction(s): BP elevation

## 2024-05-18 NOTE — Patient Instructions (Addendum)
 Preoperative instructions: Stop Zepbound 1 week prior to your procedure. Nothing to eat or drink after midnight, unless instructed differently regarding clear liquids by the anesthesia team at [facility] health. Do not take any medications on the day of surgery, except those listed below: Approved by anesthesia team at [facility] Please follow all other instructions as provided by our surgical scheduler at [facility] and the anesthesia team at [facility] health.  Postoperative instructions: Take over-the-counter Tylenol  as needed.

## 2024-05-31 ENCOUNTER — Encounter: Payer: Self-pay | Admitting: *Deleted

## 2024-06-01 ENCOUNTER — Encounter (HOSPITAL_COMMUNITY): Payer: Self-pay | Admitting: Obstetrics and Gynecology

## 2024-06-01 NOTE — Progress Notes (Signed)
 Spoke w/ via phone for pre-op interview--- J[facility] Lab needs dos----  BMP and EKG per anesthesia. Surgeon orders requested 06/01/24.       Lab results------ COVID test -----patient states asymptomatic no test needed Arrive at -------1030 NPO after MN NO Solid Food.   Pre-Surgery Ensure or G2:  Med rec completed Medications to take morning of surgery -----Norvasc  and Synthroid  Diabetic medication -----  GLP1 agonist last dose: Zepbound weekly injections. Last dose will be 06/02/24. GLP1 instructions: Hold any doses after 12/31 until after surgery.  Patient instructed no nail polish to be worn day of surgery Patient instructed to bring photo id and insurance card day of surgery Patient aware to have Driver (ride ) / caregiver    for 24 hours after surgery - Father Judiann Celia Patient Special Instructions ----- Pre-Op special Instructions -----  Patient verbalized understanding of instructions that were given at this phone interview. Patient denies chest pain, sob, fever, cough at the interview.

## 2024-06-10 ENCOUNTER — Ambulatory Visit (HOSPITAL_COMMUNITY)
Admission: RE | Admit: 2024-06-10 | Discharge: 2024-06-10 | Disposition: A | Discharge status: Home / Self Care | Attending: Obstetrics and Gynecology | Admitting: Obstetrics and Gynecology

## 2024-06-10 ENCOUNTER — Ambulatory Visit (HOSPITAL_COMMUNITY): Admitting: Anesthesiology

## 2024-06-10 ENCOUNTER — Encounter (HOSPITAL_COMMUNITY): Payer: Self-pay | Admitting: Obstetrics and Gynecology

## 2024-06-10 ENCOUNTER — Encounter (HOSPITAL_COMMUNITY)
Admission: RE | Disposition: A | Discharge status: Home / Self Care | Payer: Self-pay | Source: Home / Self Care | Attending: Obstetrics and Gynecology

## 2024-06-10 DIAGNOSIS — R9389 Abnormal findings on diagnostic imaging of other specified body structures: Secondary | ICD-10-CM | POA: Diagnosis present

## 2024-06-10 DIAGNOSIS — N840 Polyp of corpus uteri: Secondary | ICD-10-CM | POA: Insufficient documentation

## 2024-06-10 DIAGNOSIS — N950 Postmenopausal bleeding: Secondary | ICD-10-CM

## 2024-06-10 DIAGNOSIS — E039 Hypothyroidism, unspecified: Secondary | ICD-10-CM | POA: Diagnosis not present

## 2024-06-10 DIAGNOSIS — I10 Essential (primary) hypertension: Secondary | ICD-10-CM | POA: Insufficient documentation

## 2024-06-10 HISTORY — PX: DILATATION & CURRETTAGE/HYSTEROSCOPY WITH RESECTOCOPE: SHX5572

## 2024-06-10 HISTORY — PX: MYOSURE RESECTION: SHX7611

## 2024-06-10 LAB — BASIC METABOLIC PANEL WITH GFR
Anion gap: 10 (ref 5–15)
BUN: 16 mg/dL (ref 8–23)
CO2: 26 mmol/L (ref 22–32)
Calcium: 9 mg/dL (ref 8.9–10.3)
Chloride: 106 mmol/L (ref 98–111)
Creatinine, Ser: 0.63 mg/dL (ref 0.44–1.00)
GFR, Estimated: >60 mL/min
Glucose, Bld: 81 mg/dL (ref 70–99)
Potassium: 4 mmol/L (ref 3.5–5.1)
Sodium: 141 mmol/L (ref 135–145)

## 2024-06-10 SURGERY — DILATATION & CURETTAGE/HYSTEROSCOPY WITH RESECTOCOPE
Anesthesia: General | Site: Uterus

## 2024-06-10 MED ORDER — ONDANSETRON HCL 4 MG/2ML IJ SOLN
INTRAMUSCULAR | Status: DC | PRN
  Administered 2024-06-10: 4 mg via INTRAVENOUS

## 2024-06-10 MED ORDER — MIDAZOLAM HCL 5 MG/5ML IJ SOLN
INTRAMUSCULAR | Status: DC | PRN
  Administered 2024-06-10: 2 mg via INTRAVENOUS

## 2024-06-10 MED ORDER — PROPOFOL 10 MG/ML IV BOLUS
INTRAVENOUS | Status: DC | PRN
  Administered 2024-06-10: 160 mg via INTRAVENOUS

## 2024-06-10 MED ORDER — FENTANYL CITRATE (PF) 100 MCG/2ML IJ SOLN
INTRAMUSCULAR | Status: DC | PRN
  Administered 2024-06-10: 50 ug via INTRAVENOUS

## 2024-06-10 MED ORDER — PROPOFOL 10 MG/ML IV BOLUS
INTRAVENOUS | Status: AC
  Filled 2024-06-10: qty 20

## 2024-06-10 MED ORDER — AMISULPRIDE (ANTIEMETIC) 5 MG/2ML IV SOLN: 10.0000 mg | Freq: Once | INTRAVENOUS | Status: DC | PRN

## 2024-06-10 MED ORDER — OXYCODONE HCL 5 MG PO TABS
ORAL_TABLET | ORAL | Status: AC
  Filled 2024-06-10: qty 1

## 2024-06-10 MED ORDER — LACTATED RINGERS IV SOLN: INTRAVENOUS | Status: DC | PRN

## 2024-06-10 MED ORDER — OXYCODONE HCL 5 MG PO TABS
5.0000 mg | ORAL_TABLET | Freq: Once | ORAL | Status: AC | PRN
  Administered 2024-06-10: 5 mg via ORAL

## 2024-06-10 MED ORDER — LIDOCAINE HCL (PF) 1 % IJ SOLN
INTRAMUSCULAR | Status: DC | PRN
  Administered 2024-06-10: 10 mL

## 2024-06-10 MED ORDER — DEXAMETHASONE SOD PHOSPHATE PF 10 MG/ML IJ SOLN
INTRAMUSCULAR | Status: DC | PRN
  Administered 2024-06-10: 10 mg via INTRAVENOUS

## 2024-06-10 MED ORDER — CHLORHEXIDINE GLUCONATE 0.12 % MT SOLN
OROMUCOSAL | Status: AC
  Administered 2024-06-10: 15 mL via OROMUCOSAL
  Filled 2024-06-10: qty 15

## 2024-06-10 MED ORDER — DEXMEDETOMIDINE HCL IN NACL 80 MCG/20ML IV SOLN
INTRAVENOUS | Status: DC | PRN
  Administered 2024-06-10: 8 ug via INTRAVENOUS

## 2024-06-10 MED ORDER — SILVER NITRATE-POT NITRATE 75-25 % EX MISC
CUTANEOUS | Status: DC | PRN
  Administered 2024-06-10: 2 via TOPICAL

## 2024-06-10 MED ORDER — CHLORHEXIDINE GLUCONATE 0.12 % MT SOLN: 15.0000 mL | Freq: Once | OROMUCOSAL | Status: AC

## 2024-06-10 MED ORDER — FENTANYL CITRATE (PF) 100 MCG/2ML IJ SOLN
INTRAMUSCULAR | Status: AC
  Filled 2024-06-10: qty 2

## 2024-06-10 MED ORDER — MIDAZOLAM HCL 2 MG/2ML IJ SOLN
INTRAMUSCULAR | Status: AC
  Filled 2024-06-10: qty 2

## 2024-06-10 MED ORDER — LACTATED RINGERS IV SOLN: INTRAVENOUS | Status: DC

## 2024-06-10 MED ORDER — ACETAMINOPHEN 500 MG PO TABS
ORAL_TABLET | ORAL | Status: AC
  Administered 2024-06-10: 1000 mg via ORAL
  Filled 2024-06-10: qty 2

## 2024-06-10 MED ORDER — ORAL CARE MOUTH RINSE: 15.0000 mL | Freq: Once | OROMUCOSAL | Status: AC

## 2024-06-10 MED ORDER — LIDOCAINE 2% (20 MG/ML) 5 ML SYRINGE
INTRAMUSCULAR | Status: DC | PRN
  Administered 2024-06-10: 60 mg via INTRAVENOUS

## 2024-06-10 MED ORDER — OXYCODONE HCL 5 MG/5ML PO SOLN: 5.0000 mg | Freq: Once | ORAL | Status: AC | PRN

## 2024-06-10 MED ORDER — LIDOCAINE HCL 1 % IJ SOLN
INTRAMUSCULAR | Status: AC
  Filled 2024-06-10: qty 20

## 2024-06-10 MED ORDER — SODIUM CHLORIDE 0.9 % IR SOLN
Status: DC | PRN
  Administered 2024-06-10: 3000 mL

## 2024-06-10 MED ORDER — FENTANYL CITRATE (PF) 100 MCG/2ML IJ SOLN: 25.0000 ug | INTRAMUSCULAR | Status: DC | PRN

## 2024-06-10 MED ORDER — PROPOFOL 500 MG/50ML IV EMUL
INTRAVENOUS | Status: DC | PRN
  Administered 2024-06-10: 125 ug/kg/min via INTRAVENOUS

## 2024-06-10 MED ORDER — ACETAMINOPHEN 500 MG PO TABS: 1000.0000 mg | ORAL_TABLET | ORAL | Status: AC

## 2024-06-10 MED ORDER — GLYCOPYRROLATE PF 0.2 MG/ML IJ SOSY
PREFILLED_SYRINGE | INTRAMUSCULAR | Status: DC | PRN
  Administered 2024-06-10: .2 mg via INTRAVENOUS

## 2024-06-10 SURGICAL SUPPLY — 16 items
COVER MAYO STAND STRL (DRAPES) ×1 IMPLANT
DEVICE MYOSURE REACH (MISCELLANEOUS) IMPLANT
GLOVE BIO SURGEON STRL SZ7 (GLOVE) ×1 IMPLANT
GLOVE BIOGEL PI IND STRL 7.0 (GLOVE) ×1 IMPLANT
GLOVE BIOGEL PI IND STRL 7.5 (GLOVE) IMPLANT
GLOVE BIOGEL PI MICRO STRL 7 (GLOVE) IMPLANT
GOWN STRL REUS W/ TWL XL LVL3 (GOWN DISPOSABLE) ×1 IMPLANT
HIBICLENS CHG 4% 4OZ BTL (MISCELLANEOUS) ×1 IMPLANT
KIT PROCED FLUENT PRO FLT212S (KITS) ×1 IMPLANT
KIT TURNOVER KIT B (KITS) ×1 IMPLANT
PACK VAGINAL MINOR WOMEN LF (CUSTOM PROCEDURE TRAY) ×1 IMPLANT
PAD OB MATERNITY 11 LF (PERSONAL CARE ITEMS) ×1 IMPLANT
PAD PREP 24X48 CUFFED NSTRL (MISCELLANEOUS) ×1 IMPLANT
SEAL ROD LENS SCOPE MYOSURE (ABLATOR) ×1 IMPLANT
SOL .9 NS 3000ML IRR UROMATIC (IV SOLUTION) ×1 IMPLANT
TOWEL OR 17X24 6PK STRL BLUE (TOWEL DISPOSABLE) ×1 IMPLANT

## 2024-06-10 NOTE — Op Note (Signed)
 06/10/2024 Stephanie [name] [id number]  OPERATIVE REPORT  Preop Diagnosis: Pre-Op Diagnosis Codes:     * PMB (postmenopausal bleeding) [N95.0]    * Thickened endometrium [R93.89]   Post operative diagnosis: same  Procedure: Procedures: DILATATION & CURETTAGE/HYSTEROSCOPY/ POLYPECTOMY MYOSURE RESECTION   Surgeon: Vera LULLA Pa, MD Assistant: none  Anesthesia: MAC Fluids: please see anesthesia report Fluid deficit: ~350cc  Complications: None  Findings: Narrow vaginal canal.  Normal cervix. Uterine cavity measuring 10cm with both ostia seen.  Large fundal endometrial polyp, right anterior small polyp and polypoid tissue was noted around left ostia.   Estimated blood loss: Minimal  Specimens:  ID Type Source Tests Collected by Time Destination  1 : endometrial polyp Tissue PATH Gyn biopsy SURGICAL PATHOLOGY Pa Vera LULLA, MD 06/10/2024 1322   2 : endometrial curettings Tissue PATH Gyn biopsy SURGICAL PATHOLOGY Pa Vera LULLA, MD 06/10/2024 1328     Disposition of specimen: Pathology   Procedure: Patient was taken to the OR where she was placed in dorsal lithotomy in stirrups. She voided prior to transport. SCDs were in place.  The patient was prepped and draped in the usual sterile fashion. An adequate timeout was obtained and everyone agreed. A long thin weighted speculum and Dever were placed inside the vagina and the cervix was visualized. The cervix was grasped anteriorly with a single-tooth tenaculum. Paracervical block was performed with 1% lidocaine .  The uterus sounded to 10 cm. Sequential cervical dilation was done to 19 fr, and the myosure hysteroscope was introduced into the uterine cavity.  Findings as noted. Polyps were seen and completely morcellated using Myosure. The myosure was also used to sample the entire cavity.  A sharp curettage was then performed to ensure complete sampling of the cavity and was sent to pathology. All instrument, sponge and lap counts were  correct x2. The patient was awakened taken to recovery room in stable condition.  Vera LULLA Pa, MD 06/10/2024 1:38 PM

## 2024-06-10 NOTE — Anesthesia Preprocedure Evaluation (Signed)
"                                    Anesthesia Evaluation  Patient identified by MRN, date of birth, ID band Patient awake    Reviewed: Allergy & Precautions, NPO status , Patient's Chart, lab work & pertinent test results  History of Anesthesia Complications (+) PONV and history of anesthetic complications  Airway Mallampati: I  TM Distance: >3 FB Neck ROM: Full    Dental no notable dental hx. (+) Teeth Intact, Dental Advisory Given   Pulmonary neg pulmonary ROS   Pulmonary exam normal breath sounds clear to auscultation       Cardiovascular hypertension, Pt. on medications Normal cardiovascular exam Rhythm:Regular Rate:Normal     Neuro/Psych  Headaches  negative psych ROS   GI/Hepatic Neg liver ROS, hiatal hernia,,,  Endo/Other  Hypothyroidism    Renal/GU negative Renal ROS  negative genitourinary   Musculoskeletal negative musculoskeletal ROS (+)    Abdominal   Peds  Hematology negative hematology ROS (+)   Anesthesia Other Findings   Reproductive/Obstetrics                              Anesthesia Physical Anesthesia Plan  ASA: 2  Anesthesia Plan: General   Post-op Pain Management: Tylenol  PO (pre-op)*, Toradol  IV (intra-op)* and Precedex    Induction: Intravenous  PONV Risk Score and Plan: 4 or greater and Ondansetron , Dexamethasone , Midazolam  and Treatment may vary due to age or medical condition  Airway Management Planned: LMA  Additional Equipment:   Intra-op Plan:   Post-operative Plan: Extubation in OR  Informed Consent: I have reviewed the patients History and Physical, chart, labs and discussed the procedure including the risks, benefits and alternatives for the proposed anesthesia with the patient or authorized representative who has indicated his/her understanding and acceptance.     Dental advisory given  Plan Discussed with: CRNA  Anesthesia Plan Comments:         Anesthesia Quick  Evaluation  "

## 2024-06-10 NOTE — Transfer of Care (Signed)
 Immediate Anesthesia Transfer of Care Note  Patient: Stephanie [name]  Procedure(s) Performed: DILATATION & CURETTAGE/HYSTEROSCOPY/ POLYPECTOMY (Uterus) MYOSURE RESECTION (Uterus)  Patient Location: PACU  Anesthesia Type:General  Level of Consciousness: awake and patient cooperative  Airway & Oxygen Therapy: Patient Spontanous Breathing and Patient connected to face mask oxygen  Post-op Assessment: Report given to RN and Post -op Vital signs reviewed and stable  Post vital signs: Reviewed and stable  Last Vitals:  Vitals Value Taken Time  BP 110/77 06/10/24 13:46  Temp    Pulse 75 06/10/24 13:49  Resp 17 06/10/24 13:49  SpO2 93 % 06/10/24 13:49  Vitals shown include unfiled device data.  Last Pain:  Vitals:   06/10/24 1054  TempSrc: Oral  PainSc: 0-No pain      Patients Stated Pain Goal: 4 (06/10/24 1054)  Complications: No notable events documented.

## 2024-06-10 NOTE — Discharge Instructions (Signed)

## 2024-06-10 NOTE — Anesthesia Procedure Notes (Signed)
 Procedure Name: LMA Insertion Date/Time: 06/10/2024 12:58 PM  Performed by: Denton Niels CROME, CRNAPre-anesthesia Checklist: Patient identified, Emergency Drugs available, Suction available, Patient being monitored and Timeout performed Patient Re-evaluated:Patient Re-evaluated prior to induction Oxygen Delivery Method: Circle system utilized Preoxygenation: Pre-oxygenation with 100% oxygen Induction Type: IV induction Ventilation: Mask ventilation without difficulty LMA: LMA with gastric port inserted LMA Size: 4.0 Number of attempts: 1 Placement Confirmation: positive ETCO2 Dental Injury: Teeth and Oropharynx as per pre-operative assessment

## 2024-06-10 NOTE — Interval H&P Note (Signed)
 Date of Initial H&P: 05/18/24  History reviewed, patient examined, no change in status, stable for surgery.

## 2024-06-11 ENCOUNTER — Encounter (HOSPITAL_COMMUNITY): Payer: Self-pay | Admitting: Obstetrics and Gynecology

## 2024-06-11 NOTE — Anesthesia Postprocedure Evaluation (Signed)
"   Anesthesia Post Note  Patient: Stephanie [name]  Procedure(s) Performed: DILATATION & CURETTAGE/HYSTEROSCOPY/ POLYPECTOMY (Uterus) MYOSURE RESECTION (Uterus)     Patient location during evaluation: PACU Anesthesia Type: General Level of consciousness: awake and alert Pain management: pain level controlled Vital Signs Assessment: post-procedure vital signs reviewed and stable Respiratory status: spontaneous breathing, nonlabored ventilation, respiratory function stable and patient connected to nasal cannula oxygen Cardiovascular status: blood pressure returned to baseline and stable Postop Assessment: no apparent nausea or vomiting Anesthetic complications: no   No notable events documented.  Last Vitals:  Vitals:   06/10/24 1415 06/10/24 1444  BP:  (!) 156/95  Pulse: 65 62  Resp:    Temp:    SpO2: 97% 98%    Last Pain:  Vitals:   06/10/24 1428  TempSrc:   PainSc: 4                  Archana Eckman L Zykeem Bauserman      "

## 2024-06-14 ENCOUNTER — Ambulatory Visit: Payer: Self-pay | Admitting: Obstetrics and Gynecology

## 2024-06-14 DIAGNOSIS — N840 Polyp of corpus uteri: Secondary | ICD-10-CM

## 2024-06-14 DIAGNOSIS — N950 Postmenopausal bleeding: Secondary | ICD-10-CM

## 2024-06-14 LAB — SURGICAL PATHOLOGY

## 2024-06-18 NOTE — Telephone Encounter (Signed)
 We are still waiting on surgical clearance from PCP. I sent a fax request on 05/25/24 anticipating hysterectomy, second request sent today.   Have you received clearance?

## 2024-06-24 NOTE — Telephone Encounter (Signed)
Routing FYI.   Encounter closed.

## 2024-06-25 ENCOUNTER — Encounter: Payer: Self-pay | Admitting: Obstetrics and Gynecology

## 2024-06-25 ENCOUNTER — Ambulatory Visit: Admitting: Obstetrics and Gynecology

## 2024-06-25 VITALS — BP 102/76 | HR 84 | Temp 98.4°F | Wt 178.0 lb

## 2024-06-25 DIAGNOSIS — N950 Postmenopausal bleeding: Secondary | ICD-10-CM

## 2024-06-25 DIAGNOSIS — N840 Polyp of corpus uteri: Secondary | ICD-10-CM

## 2024-06-25 DIAGNOSIS — N958 Other specified menopausal and perimenopausal disorders: Secondary | ICD-10-CM | POA: Diagnosis not present

## 2024-06-25 DIAGNOSIS — Z09 Encounter for follow-up examination after completed treatment for conditions other than malignant neoplasm: Secondary | ICD-10-CM | POA: Diagnosis not present

## 2024-06-25 DIAGNOSIS — Z01818 Encounter for other preprocedural examination: Secondary | ICD-10-CM

## 2024-06-25 LAB — BASIC METABOLIC PANEL WITH GFR
BUN: 18 mg/dL (ref 7–25)
CO2: 26 mmol/L (ref 20–32)
Calcium: 9.2 mg/dL (ref 8.6–10.4)
Chloride: 104 mmol/L (ref 98–110)
Creat: 0.57 mg/dL (ref 0.50–1.05)
Glucose, Bld: 75 mg/dL (ref 65–99)
Potassium: 4 mmol/L (ref 3.5–5.3)
Sodium: 140 mmol/L (ref 135–146)
eGFR: 103 mL/min/1.73m2

## 2024-06-25 LAB — CBC
HCT: 43.2 % (ref 35.9–46.0)
Hemoglobin: 14.2 g/dL (ref 11.7–15.5)
MCH: 30.5 pg (ref 27.0–33.0)
MCHC: 32.9 g/dL (ref 31.6–35.4)
MCV: 92.9 fL (ref 81.4–101.7)
MPV: 10.2 fL (ref 7.5–12.5)
Platelets: 302 Thousand/uL (ref 140–400)
RBC: 4.65 Million/uL (ref 3.80–5.10)
RDW: 13 % (ref 11.0–15.0)
WBC: 9.1 Thousand/uL (ref 3.8–10.8)

## 2024-06-25 MED ORDER — ESTRADIOL 0.01 % VA CREA: TOPICAL_CREAM | VAGINAL | 1 refills | Status: AC

## 2024-06-25 NOTE — Progress Notes (Signed)
 "  64 y.o. G65P1001 female with PMB, recurrent endometrial polyps, uterine fibroids with prior abdominal myomectomy via midline incision (2002) and CD x1 (Pfannenstiel, 2004), sleeve gastrectomy, prior thyroidectomy, HTN here for postoperative exam: 2 weeks s/p diagnostic hysteroscopy, D&C, polypectomy. Single. Retired. Referred by Dr. Nikki.  Pt states she is doing well. Denies CP, SOB, N/V, abdominal pain, VB, dysuria. Normal BM and voids.  06/10/24 path: A. ENDOMETRIUM, POLYPECTOMY:  Benign endometrial polyp  Negative for malignancy   B. ENDOMETRIUM, CURETTAGE:  Benign weakly proliferative to inactive endometrial epithelium  Benign endocervical epithelium  Benign cervical metaplastic squamous epithelium  Negative for breakdown, atypia, hyperplasia and carcinoma   She ultimately desires treatment for recurrent polyps in the form of hysterectomy, and she would like to proceed with scheduling this.  Her history is notable for dilation and curettage x 2 for polyps in 2020 and 2022, benign pathology. Her anatomy suggests intraabdominal pathology in the form of fibroids or adhesions.  Migraine with dilaudid   04/22/2024 TVUS: Findings: Pelvic US : Uterus 9.54 x 6.15 x 4.23 cm.  Fibroids:  5.27 cm pedunculated, left; 2.64 cm intramural EMS 16.93 mm.  Avascular.  Left ovary 1.95 x 1.27 x 1.02 cm.  11.1 mm follicle. Right ovary 2.56 x 1.61 x 1.66 cm.   7.1 mm follicle No adnexal masses.  No free fluid.    Impression:  pedunculated and intramural fibroids.  Thickened endometrium.  Histologic correlation is recommended.  Normal ovaries.  Last PAP:    Component Value Date/Time   DIAGPAP  04/19/2024 1620    - Negative for intraepithelial lesion or malignancy (NILM)   HPVHIGH Negative 04/19/2024 1620   ADEQPAP  04/19/2024 1620    Satisfactory for evaluation. The presence or absence of an   ADEQPAP  04/19/2024 1620    endocervical/transformation zone component cannot be determined  because   ADEQPAP of atrophy. 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/   s/p RAI 11-20-2017 for residual remnant   Hypertension    followed by pcp   Polycystic ovarian syndrome    PONV (postoperative nausea and vomiting)    Post-surgical hypothyroidism    endocrinologist--- dr faythe   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   gastric sleev   Uterine fibroid    Wears glasses     Past Surgical History:  Procedure Laterality Date   BREAST REDUCTION SURGERY  1994   CESAREAN SECTION  10/2002   CHOLECYSTECTOMY N/A 04/07/2013   Procedure: LAPAROSCOPIC CHOLECYSTECTOMY;  Surgeon: Elspeth KYM Schultze, MD;  Location: [facility];  Service: General;  Laterality: N/A;   DILATATION & CURETTAGE/HYSTEROSCOPY WITH MYOSURE N/A 01/05/2019   Procedure: DILATATION & CURETTAGE/HYSTEROSCOPY WITH MYOSURE;  Surgeon: Rockney Evalene SQUIBB, MD;  Location: [facility];  Service: Gynecology;  Laterality: N/A;  request to follow at 9:00am in [facility] block requests one hour   DILATATION & CURETTAGE/HYSTEROSCOPY WITH MYOSURE N/A 06/23/2020   Procedure: DILATATION & CURETTAGE/HYSTEROSCOPY WITH  MYOSURE/POLYPECTOMY;  Surgeon: Arnaldo Purchase, MD;  Location: [facility];  Service: Gynecology;  Laterality: N/A;  request to follow in [place] Iqueue held time   DILATATION & CURRETTAGE/HYSTEROSCOPY WITH RESECTOCOPE N/A 06/10/2024   Procedure: DILATATION & CURETTAGE/HYSTEROSCOPY/ POLYPECTOMY;  Surgeon: Dallie Vera GAILS,  MD;  Location: [facility] OR;  Service: Gynecology;  Laterality: N/A;  Myosure   HYSTEROSCOPY W/ RESECTION POLYP  09-08-2001   dr darina  @[facility]    LAPAROSCOPIC GASTRIC SLEEVE RESECTION WITH HIATAL HERNIA REPAIR N/A 05/14/2016   Procedure: LAPAROSCOPIC GASTRIC SLEEVE RESECTION WITH POSSIBLE HIATAL HERNIA REPAIR, UPPER ENDO;  Surgeon: Donnice Lunger, MD;  Location: [facility];  Service: General;  Laterality: N/A;   LIVER BIOPSY N/A 04/07/2013   Procedure: LIVER BIOPSY;  Surgeon: Elspeth KYM Schultze, MD;  Location: [facility];  Service: General;  Laterality: N/A;   MYOMECTOMY ABDOMINAL APPROACH  11/2000   MYOSURE RESECTION N/A 06/10/2024   Procedure: MELINDA RESECTION;  Surgeon: Dallie Vera GAILS, MD;  Location: [facility] OR;  Service: Gynecology;  Laterality: N/A;   THYROIDECTOMY N/A 09/02/2017   Procedure: TOTAL THYROIDECTOMY;  Surgeon: Eletha Boas, MD;  Location: [facility];  Service: General;  Laterality: N/A;    Medications Ordered Prior to Encounter[1]  Allergies[2]    PE Today's Vitals   06/25/24 1043  BP: 102/76  Pulse: 84  Temp: 98.4 F (36.9 C)  TempSrc: Oral  SpO2: 97%  Weight: 178 lb (80.7 kg)   Body mass index is 29.85 kg/m.  Physical Exam Vitals reviewed.  Constitutional:      General: She is not in acute distress.    Appearance: Normal appearance.  HENT:     Head: Normocephalic and atraumatic.     Nose: Nose normal.  Eyes:     Extraocular Movements: Extraocular movements intact.     Conjunctiva/sclera: Conjunctivae normal.  Cardiovascular:     Rate and Rhythm: Normal rate and regular rhythm.     Heart sounds: No murmur heard.    No friction rub. No gallop.  Pulmonary:     Effort: Pulmonary effort is normal. No respiratory distress.     Breath sounds: Normal breath sounds. No stridor. No wheezing, rhonchi or rales.  Abdominal:     General: There is no distension.     Palpations: Abdomen is soft.     Tenderness: There is no abdominal tenderness.      Comments: Prior incision sites   Musculoskeletal:        General: Normal range of motion.     Cervical back: Normal range of motion.  Neurological:     General: No focal deficit present.     Mental Status: She is alert.  Psychiatric:        Mood and Affect: Mood normal.        Behavior: Behavior normal.      Assessment and Plan:        Postop check Normal postop exam.  Pathology reviewed with patient. Cleared to return to work. All questions answered.  Endometrial polyp Postmenopausal bleeding Assessment & Plan: Patient with history of recurrent benign endometrial polyps following menopause.  She has had 3 hysteroscopic polypectomies over the last 5 years for postmenopausal bleeding.  Her most recent hysteroscopy revealed proliferative endometrium despite menopausal status.  She desires definitive treatment in the form of a hysterectomy.  In patients over 55yo, we discussed the risk and benefits of ovarian conservation versus opportunistic oophorectomy.  This patient has decided to proceed with oophorectomy. Plan for robotic assisted  total laparoscopic hysterectomy, bilateral salpingoophorectomy, cystoscopy.  Discussed outpatient procedure. Reviewed that  recovery is usually 6 weeks with additional 4 weeks of pelvic rest. Risks including infections, bleeding, damage to surrounding organs especially in the setting of multiple prior pelvic surgeries, and risk of conversion to laparotomy were reviewed.  Patient is agreeable to blood transfusion in the event of an emergency.    Preop checklist: Meds and allergies reviewed. Antibiotics: Ancef , flagyl DVT ppx: SCDs, lovenox Postop visit: 2, 6 week Additional clearance:  completed 06/24/24 Tubal and hysterectomy papers signed: N/A, [employer]   Orders: -     CBC -     Basic metabolic panel with GFR  Genitourinary syndrome of menopause Assessment & Plan: Significant vaginal narrowing was noted during hysteroscopy I recommend that she start vaginal estrogen in  preparation for surgery and continue postoperatively  Orders: -     Estradiol ; Apply 0.5g vaginally nightly for 2 weeks then 3 times a week.  Dispense: 42.5 g; Refill: 1  Preop examination -     CBC -     Basic metabolic panel with GFR   Vera LULLA Pa, MD         [1]  Current Outpatient Medications on File Prior to Visit  Medication Sig Dispense Refill   acetaminophen  (TYLENOL ) 500 MG tablet Take 2 tablets (1,000 mg total) by mouth every 6 (six) hours as needed for mild pain (cramps).     benazepril  (LOTENSIN ) 20 MG tablet Take 1 tablet (20 mg total) by mouth daily. 90 tablet 1   Cholecalciferol 1.25 MG (50000 UT) capsule 1 capsule.     levothyroxine  (SYNTHROID ) 125 MCG tablet Take 125 mcg by mouth daily.     Multiple Vitamin (MULTIVITAMIN WITH MINERALS) TABS tablet Take 1 tablet by mouth daily.     Oyster Shell Calcium  500 MG TABS 3 tablet with meals     ZEPBOUND 12.5 MG/0.5ML Pen 12.5 mg.     No current facility-administered medications on file prior to visit.  [2]  Allergies Allergen Reactions   Bupropion     Other Reaction(s): BP elevation   Dilaudid  [Hydromorphone ] Nausea And Vomiting and Other (See Comments)    Severe headache   Fentanyl  Nausea And Vomiting    severe   Morphine  And Codeine  Other (See Comments)    headache   Naltrexone-Bupropion Hcl Er Nausea Only   Nsaids Other (See Comments)    Bariatric Patient    Topiramate     Other Reaction(s): BP elevation   "

## 2024-06-25 NOTE — Assessment & Plan Note (Signed)
 Patient with history of recurrent benign endometrial polyps following menopause.  She has had 3 hysteroscopic polypectomies over the last 5 years for postmenopausal bleeding.  Her most recent hysteroscopy revealed proliferative endometrium despite menopausal status.  She desires definitive treatment in the form of a hysterectomy.  In patients over 63yo, we discussed the risk and benefits of ovarian conservation versus opportunistic oophorectomy.  This patient has decided to proceed with oophorectomy. Plan for robotic assisted total laparoscopic hysterectomy, bilateral salpingoophorectomy, cystoscopy.  Discussed outpatient procedure. Reviewed that  recovery is usually 6 weeks with additional 4 weeks of pelvic rest. Risks including infections, bleeding, damage to surrounding organs especially in the setting of multiple prior pelvic surgeries, and risk of conversion to laparotomy were reviewed.  Patient is agreeable to blood transfusion in the event of an emergency.    Preop checklist: Meds and allergies reviewed. Antibiotics: Ancef , flagyl DVT ppx: SCDs, lovenox Postop visit: 2, 6 week Additional clearance:  completed 06/24/24 Tubal and hysterectomy papers signed: N/A, [place]

## 2024-06-25 NOTE — Patient Instructions (Addendum)
 Preoperative instructions: Stop Zepbound 1 week prior to your procedure. Nothing to eat or drink after midnight, unless instructed differently regarding clear liquids by the anesthesia team at [facility] health. Do not take any medications on the day of surgery, except those listed below: Approved by anesthesia team at [facility] Please follow all other instructions as provided by our surgical scheduler at [facility] and the anesthesia team at [facility] health.  Postoperative instructions: Clean your incision daily with mild soapy water .  Sitz bath are helpful for wound healing.   Ensure that you thoroughly dry your wound following cleaning and keep dry throughout the day.  You can apply a thin layer of Aquaphor or Vaseline over your incision. Ice packs can be applied to your incision for up to 20 minutes at a time to help with pain and swelling.  Take ibuprofen  as prescribed and over-the-counter Tylenol  as needed. (DO NOT TAKE IBUPROFEN  IF YOU HAVE CONTRAINDICATIONS THAT MAY INCLUDE USE OF BLOOD THINNERS, HISTORY STOMACH SURGERY OR GASTRITIS, CHRONIC KIDNEY DISEASE, ALLERGY, PRIOR INSTRUCTION TO AVOID IBUPROFEN -LIKE MEDICATIONS.)

## 2024-06-25 NOTE — Assessment & Plan Note (Signed)
 Significant vaginal narrowing was noted during hysteroscopy I recommend that she start vaginal estrogen in preparation for surgery and continue postoperatively

## 2024-06-29 ENCOUNTER — Ambulatory Visit: Payer: Self-pay | Admitting: Obstetrics and Gynecology

## 2024-08-04 ENCOUNTER — Ambulatory Visit (HOSPITAL_COMMUNITY): Admit: 2024-08-04 | Admitting: Obstetrics and Gynecology

## 2025-05-02 ENCOUNTER — Ambulatory Visit: Admitting: Obstetrics and Gynecology
